# Patient Record
Sex: Male | Born: 1945 | Race: White | Hispanic: No | Marital: Married | Smoking: Former smoker
Health system: Southern US, Community
[De-identification: ages and names within clinical notes are randomized; demographics above are authoritative.]

## PROBLEM LIST (undated history)

## (undated) ENCOUNTER — Emergency Department (HOSPITAL_COMMUNITY): Payer: Self-pay | Source: Home / Self Care

## (undated) DIAGNOSIS — B029 Zoster without complications: Secondary | ICD-10-CM

## (undated) DIAGNOSIS — E785 Hyperlipidemia, unspecified: Secondary | ICD-10-CM

## (undated) DIAGNOSIS — I639 Cerebral infarction, unspecified: Secondary | ICD-10-CM

## (undated) DIAGNOSIS — E119 Type 2 diabetes mellitus without complications: Secondary | ICD-10-CM

## (undated) HISTORY — DX: Hyperlipidemia, unspecified: E78.5

## (undated) HISTORY — PX: NO PAST SURGERIES: SHX2092

---

## 2002-10-12 ENCOUNTER — Inpatient Hospital Stay (HOSPITAL_COMMUNITY): Admission: AC | Admit: 2002-10-12 | Discharge: 2002-10-25 | Payer: Self-pay

## 2002-10-12 ENCOUNTER — Encounter: Payer: Self-pay | Admitting: General Surgery

## 2002-10-12 ENCOUNTER — Encounter: Payer: Self-pay | Admitting: Emergency Medicine

## 2002-10-13 ENCOUNTER — Encounter: Payer: Self-pay | Admitting: General Surgery

## 2002-11-07 ENCOUNTER — Emergency Department (HOSPITAL_COMMUNITY): Admission: EM | Admit: 2002-11-07 | Discharge: 2002-11-07 | Payer: Self-pay | Admitting: Emergency Medicine

## 2002-12-15 ENCOUNTER — Encounter: Admission: RE | Admit: 2002-12-15 | Discharge: 2003-02-27 | Payer: Self-pay | Admitting: Orthopaedic Surgery

## 2007-11-19 ENCOUNTER — Emergency Department (HOSPITAL_COMMUNITY): Admission: EM | Admit: 2007-11-19 | Discharge: 2007-11-20 | Payer: Self-pay | Admitting: Emergency Medicine

## 2010-03-24 ENCOUNTER — Encounter: Payer: Self-pay | Admitting: Internal Medicine

## 2011-09-09 ENCOUNTER — Other Ambulatory Visit: Payer: Self-pay | Admitting: Family Medicine

## 2011-09-09 ENCOUNTER — Ambulatory Visit
Admission: RE | Admit: 2011-09-09 | Discharge: 2011-09-09 | Disposition: A | Discharge status: Home / Self Care | Payer: Medicare Other | Source: Ambulatory Visit | Attending: Family Medicine | Admitting: Family Medicine

## 2011-09-09 DIAGNOSIS — M25569 Pain in unspecified knee: Secondary | ICD-10-CM

## 2011-12-16 ENCOUNTER — Emergency Department (HOSPITAL_COMMUNITY): Payer: Medicare Other

## 2011-12-16 ENCOUNTER — Encounter (HOSPITAL_COMMUNITY): Payer: Self-pay | Admitting: Emergency Medicine

## 2011-12-16 ENCOUNTER — Observation Stay (HOSPITAL_COMMUNITY)
Admission: EM | Admit: 2011-12-16 | Discharge: 2011-12-18 | Disposition: A | Discharge status: Home / Self Care | Payer: Medicare Other | Attending: Internal Medicine | Admitting: Internal Medicine

## 2011-12-16 ENCOUNTER — Observation Stay (HOSPITAL_COMMUNITY): Payer: Medicare Other

## 2011-12-16 DIAGNOSIS — Z79899 Other long term (current) drug therapy: Secondary | ICD-10-CM | POA: Insufficient documentation

## 2011-12-16 DIAGNOSIS — R269 Unspecified abnormalities of gait and mobility: Secondary | ICD-10-CM | POA: Insufficient documentation

## 2011-12-16 DIAGNOSIS — R209 Unspecified disturbances of skin sensation: Secondary | ICD-10-CM | POA: Insufficient documentation

## 2011-12-16 DIAGNOSIS — G4700 Insomnia, unspecified: Secondary | ICD-10-CM | POA: Insufficient documentation

## 2011-12-16 DIAGNOSIS — I658 Occlusion and stenosis of other precerebral arteries: Secondary | ICD-10-CM | POA: Insufficient documentation

## 2011-12-16 DIAGNOSIS — Z7982 Long term (current) use of aspirin: Secondary | ICD-10-CM | POA: Insufficient documentation

## 2011-12-16 DIAGNOSIS — R569 Unspecified convulsions: Secondary | ICD-10-CM

## 2011-12-16 DIAGNOSIS — I10 Essential (primary) hypertension: Secondary | ICD-10-CM

## 2011-12-16 DIAGNOSIS — E785 Hyperlipidemia, unspecified: Secondary | ICD-10-CM

## 2011-12-16 DIAGNOSIS — G459 Transient cerebral ischemic attack, unspecified: Secondary | ICD-10-CM

## 2011-12-16 DIAGNOSIS — E119 Type 2 diabetes mellitus without complications: Secondary | ICD-10-CM

## 2011-12-16 DIAGNOSIS — K219 Gastro-esophageal reflux disease without esophagitis: Secondary | ICD-10-CM

## 2011-12-16 DIAGNOSIS — R471 Dysarthria and anarthria: Secondary | ICD-10-CM | POA: Insufficient documentation

## 2011-12-16 HISTORY — DX: Zoster without complications: B02.9

## 2011-12-16 HISTORY — DX: Transient cerebral ischemic attack, unspecified: G45.9

## 2011-12-16 HISTORY — DX: Type 2 diabetes mellitus without complications: E11.9

## 2011-12-16 LAB — RAPID URINE DRUG SCREEN, HOSP PERFORMED
Amphetamines: NOT DETECTED
Barbiturates: NOT DETECTED
Benzodiazepines: NOT DETECTED
Cocaine: NOT DETECTED
Opiates: NOT DETECTED
Tetrahydrocannabinol: NOT DETECTED

## 2011-12-16 LAB — URINALYSIS, ROUTINE W REFLEX MICROSCOPIC
Bilirubin Urine: NEGATIVE
Glucose, UA: >1000 mg/dL — AB
Hgb urine dipstick: NEGATIVE
Leukocytes, UA: NEGATIVE
Nitrite: NEGATIVE
Protein, ur: NEGATIVE mg/dL
Specific Gravity, Urine: 1.024 (ref 1.005–1.030)
Urobilinogen, UA: 0.2 mg/dL (ref 0.0–1.0)
pH: 6 (ref 5.0–8.0)

## 2011-12-16 LAB — CBC
HCT: 44.4 % (ref 39.0–52.0)
Hemoglobin: 16.3 g/dL (ref 13.0–17.0)
MCH: 31.7 pg (ref 26.0–34.0)
MCHC: 36.7 g/dL — ABNORMAL HIGH (ref 30.0–36.0)
MCV: 86.2 fL (ref 78.0–100.0)
Platelets: 165 10*3/uL (ref 150–400)
RBC: 5.15 MIL/uL (ref 4.22–5.81)
RDW: 12.6 % (ref 11.5–15.5)
WBC: 8.9 10*3/uL (ref 4.0–10.5)

## 2011-12-16 LAB — COMPREHENSIVE METABOLIC PANEL
ALT: 27 U/L (ref 0–53)
AST: 22 U/L (ref 0–37)
Albumin: 4 g/dL (ref 3.5–5.2)
Alkaline Phosphatase: 81 U/L (ref 39–117)
BUN: 15 mg/dL (ref 6–23)
CO2: 21 mEq/L (ref 19–32)
Calcium: 9.7 mg/dL (ref 8.4–10.5)
Chloride: 100 mEq/L (ref 96–112)
Creatinine, Ser: 0.81 mg/dL (ref 0.50–1.35)
GFR calc Af Amer: >90 mL/min (ref >90)
GFR calc non Af Amer: >90 mL/min (ref >90)
Glucose, Bld: 261 mg/dL — ABNORMAL HIGH (ref 70–99)
Potassium: 4.1 mEq/L (ref 3.5–5.1)
Sodium: 135 mEq/L (ref 135–145)
Total Bilirubin: 0.3 mg/dL (ref 0.3–1.2)
Total Protein: 6.6 g/dL (ref 6.0–8.3)

## 2011-12-16 LAB — CBC WITH DIFFERENTIAL/PLATELET
Basophils Absolute: 0.1 10*3/uL (ref 0.0–0.1)
Basophils Relative: 1 % (ref 0–1)
Eosinophils Absolute: 0.5 10*3/uL (ref 0.0–0.7)
Eosinophils Relative: 7 % — ABNORMAL HIGH (ref 0–5)
HCT: 44.7 % (ref 39.0–52.0)
Hemoglobin: 16.3 g/dL (ref 13.0–17.0)
Lymphocytes Relative: 29 % (ref 12–46)
Lymphs Abs: 2.3 10*3/uL (ref 0.7–4.0)
MCH: 31.3 pg (ref 26.0–34.0)
MCHC: 36.5 g/dL — ABNORMAL HIGH (ref 30.0–36.0)
MCV: 86 fL (ref 78.0–100.0)
Monocytes Absolute: 0.6 10*3/uL (ref 0.1–1.0)
Monocytes Relative: 8 % (ref 3–12)
Neutro Abs: 4.3 10*3/uL (ref 1.7–7.7)
Neutrophils Relative %: 55 % (ref 43–77)
Platelets: 170 10*3/uL (ref 150–400)
RBC: 5.2 MIL/uL (ref 4.22–5.81)
RDW: 12.7 % (ref 11.5–15.5)
WBC: 7.8 10*3/uL (ref 4.0–10.5)

## 2011-12-16 LAB — ETHANOL: Alcohol, Ethyl (B): <11 mg/dL (ref 0–11)

## 2011-12-16 LAB — URINE MICROSCOPIC-ADD ON

## 2011-12-16 LAB — GLUCOSE, CAPILLARY
Glucose-Capillary: 305 mg/dL — ABNORMAL HIGH (ref 70–99)
Glucose-Capillary: 111 mg/dL — ABNORMAL HIGH (ref 70–99)
Glucose-Capillary: 200 mg/dL — ABNORMAL HIGH (ref 70–99)

## 2011-12-16 LAB — CREATININE, SERUM
Creatinine, Ser: 0.81 mg/dL (ref 0.50–1.35)
GFR calc Af Amer: >90 mL/min (ref >90)
GFR calc non Af Amer: >90 mL/min (ref >90)

## 2011-12-16 LAB — TROPONIN I: Troponin I: <0.3 ng/mL (ref <0.30)

## 2011-12-16 MED ORDER — PANTOPRAZOLE SODIUM 40 MG PO TBEC
40.0000 mg | DELAYED_RELEASE_TABLET | Freq: Every day | ORAL | Status: DC
  Administered 2011-12-16 – 2011-12-18 (×3): 40 mg via ORAL
  Filled 2011-12-16 (×3): qty 1

## 2011-12-16 MED ORDER — ACETAMINOPHEN 325 MG PO TABS: 650.0000 mg | ORAL_TABLET | ORAL | Status: DC | PRN

## 2011-12-16 MED ORDER — ATORVASTATIN CALCIUM 10 MG PO TABS
10.0000 mg | ORAL_TABLET | Freq: Every day | ORAL | Status: DC
  Administered 2011-12-16 – 2011-12-17 (×2): 10 mg via ORAL
  Filled 2011-12-16 (×3): qty 1

## 2011-12-16 MED ORDER — ASPIRIN EC 81 MG PO TBEC
162.0000 mg | DELAYED_RELEASE_TABLET | Freq: Every day | ORAL | Status: DC
  Administered 2011-12-16: 162 mg via ORAL
  Filled 2011-12-16: qty 2

## 2011-12-16 MED ORDER — SODIUM CHLORIDE 0.9 % IV SOLN
INTRAVENOUS | Status: DC
  Administered 2011-12-16: 12:00:00 via INTRAVENOUS

## 2011-12-16 MED ORDER — CLOPIDOGREL BISULFATE 75 MG PO TABS
75.0000 mg | ORAL_TABLET | Freq: Every day | ORAL | Status: DC
  Administered 2011-12-17 – 2011-12-18 (×2): 75 mg via ORAL
  Filled 2011-12-16 (×4): qty 1

## 2011-12-16 MED ORDER — SODIUM CHLORIDE 0.9 % IV SOLN
INTRAVENOUS | Status: AC
  Administered 2011-12-16: 22:00:00 via INTRAVENOUS

## 2011-12-16 MED ORDER — LOSARTAN POTASSIUM 50 MG PO TABS
50.0000 mg | ORAL_TABLET | Freq: Every day | ORAL | Status: DC
  Administered 2011-12-16 – 2011-12-18 (×3): 50 mg via ORAL
  Filled 2011-12-16 (×3): qty 1

## 2011-12-16 MED ORDER — HEPARIN SODIUM (PORCINE) 5000 UNIT/ML IJ SOLN
5000.0000 [IU] | Freq: Three times a day (TID) | INTRAMUSCULAR | Status: DC
  Administered 2011-12-16 – 2011-12-18 (×5): 5000 [IU] via SUBCUTANEOUS
  Filled 2011-12-16 (×8): qty 1

## 2011-12-16 MED ORDER — GLIMEPIRIDE 4 MG PO TABS
4.0000 mg | ORAL_TABLET | Freq: Every day | ORAL | Status: DC
  Administered 2011-12-17 – 2011-12-18 (×2): 4 mg via ORAL
  Filled 2011-12-16 (×4): qty 1

## 2011-12-16 MED ORDER — INSULIN ASPART 100 UNIT/ML ~~LOC~~ SOLN
0.0000 [IU] | Freq: Three times a day (TID) | SUBCUTANEOUS | Status: DC
  Administered 2011-12-17: 3 [IU] via SUBCUTANEOUS
  Administered 2011-12-17 (×2): 2 [IU] via SUBCUTANEOUS

## 2011-12-16 MED ORDER — ASPIRIN 81 MG PO CHEW
324.0000 mg | CHEWABLE_TABLET | Freq: Once | ORAL | Status: AC
  Administered 2011-12-16: 324 mg via ORAL
  Filled 2011-12-16: qty 4

## 2011-12-16 NOTE — ED Notes (Signed)
Per wife/pt, patient has been having episodes of dizziness, staring off into space-went to PCP, he recommended a Neurologist

## 2011-12-16 NOTE — Consult Note (Signed)
Reason for Consult: Transient episodes of disequilibrium Referring Physician: Doug Sou  CC: Transient episodes of unsteadiness  History is obtained from: Patient  HPI: Gavin [name] is an 66 y.o. male now 3 episodes of concern over the past 2 weeks. The first occurred approximately 2 weeks ago after taking Ambien. He is told by his wife he was standing up going the bathroom repeatedly, may been stumbling slightly. He does not remember this episode  He stopped taking Ambien at that time, and has had 2 new episodes over the past 2 days.  The first occurred yesterday, he stood up and then felt like he suddenly became off balance. He went out to lunch with his wife who said that he was acting confused throughout the lunch. He has no recollection of the lunch. He states that this would have lasted for at least an hour.  Today, he again had onset of symptoms shortly after standing up. Today he remembers the event, and had disequilibrium as well as double vision. It was much shorter than yesterday's.   He states that he has had neck stiffness going on for at least a couple of days and is most prominent in his left posterior neck.  He denies prior episodes of lost time, he does have a history of head trauma at age 74.  ROS: An 11 point ROS was performed and is negative except as noted in the HPI.  Past Medical History  Diagnosis Date  . Diabetes mellitus without complication   . Shingles     Family History: No history of strokes or seizure  Social History: Tob: Denies EtOH: Occasional  Exam: Current vital signs: BP 142/71  Pulse 87  Temp 97.7 F (36.5 C) (Oral)  Resp 18  Ht 5\' 6"  (1.676 m)  Wt 76.658 kg (169 lb)  BMI 27.28 kg/m2  SpO2 99% Vital signs in last 24 hours: Temp:  [97.7 F (36.5 C)-98.7 F (37.1 C)] 97.7 F (36.5 C) (10/15 2149) Pulse Rate:  [72-91] 87  (10/15 2149) Resp:  [14-18] 18  (10/15 2149) BP: (131-142)/(71-86) 142/71 mmHg (10/15 2149) SpO2:   [98 %-100 %] 99 % (10/15 2149) Weight:  [76.658 kg (169 lb)] 76.658 kg (169 lb) (10/15 1824)  General: In bed, no apparent distress CV: Regular rate and rhythm Mental Status: Patient is awake, alert, oriented to person, place, month, year, and situation. Immediate and remote memory are intact. Patient is able to give a clear and coherent history. Cranial Nerves: II: Visual Fields are full. Pupils are equal, round, and reactive to light.  Discs are difficult to visualize. III,IV, VI: EOMI without ptosis or diploplia.  V,VII: Facial sensation and movement are symmetric.  VIII: hearing is intact to voice X: Uvula elevates symmetrically XI: Shoulder shrug is symmetric. XII: tongue is midline without atrophy or fasciculations.  Motor: Tone is normal. Bulk is normal. 5/5 strength was present in all four extremities.  Sensory: Sensation is symmetric to light touch and temperature in the arms and legs. Deep Tendon Reflexes: 2+ and symmetric in the biceps and patellae.  Cerebellar: FNF and HKS are intact bilaterally Gait: Did not assess 2/2 patient safety concerns  I have reviewed labs in epic and the results pertinent to this consultation are:  I have reviewed the images obtained: MRI brain - no infarct  Impression: 66 year old male with 2 episodes of transient disequilibrium, diplopia, slurred speech, one of which was associated with amnesia. The episode 2 weeks prior is difficult to assess given  that he was taking Ambien.  History is concerning for posterior circulation TIAs, particularly with his left neck pain, I am concerned that he may have had a left vertebral dissection. This could cause posterior circulation insufficiency resulting hypoperfusion after standing. I agree that changing him to Plavix would be prudent given that these TIAs have occurred while on aspirin.  The posterior circulation does contribute to memory, but a amnestic period of an hour is unusual for TIA. Given  this, I also feel that an EEG is warranted as seizure is in the differential. If this is negative, then I would consider this a TIA.  Recommendations: 1. HgbA1c, fasting lipid panel 2. MRA  Neck with contrast 3. Echocardiogram 4. No need for carotid dopplers given MRA neck being ordered 5. Prophylactic therapy-Antiplatelet med: Plavix 75mg  Qday 6. Risk factor modification 7. Telemetry monitoring 8. Frequent neuro checks 9. EEG  Ritta Slot, MD [facility] [phone]  If 7pm- 7am, please page neurology on call at [phone].

## 2011-12-16 NOTE — ED Provider Notes (Signed)
History     CSN: [id number]  Arrival date & time 12/16/11  1041   First MD Initiated Contact with Patient 12/16/11 1112      Chief Complaint  Patient presents with  . Altered Mental Status    (Consider location/radiation/quality/duration/timing/severity/associated sxs/prior treatment) HPI Pt reports a couple of weeks ago after he took an Ambien, he was acting strangely and his wife called EMS, they evaluated him but did not transport him. Yesterday he was out to dinner with his wife and son and states he had episodes of acting strange "like he was drunk" unsteady walking and falling asleep at the dinner table. He went to see his PCP who recommended outpatient neurology appointment but symptoms returned today and he felt like his L arm was heavy so he came to the ED for evaluation. He has not had any additional Ambien recently. History of diabetes but apparently inconsistent with medications. Wife dropped patient off and went shopping. She is unavailable for discussion at the time of my evaluation.   Past Medical History  Diagnosis Date  . Diabetes mellitus without complication     History reviewed. No pertinent past surgical history.  No family history on file.  History  Substance Use Topics  . Smoking status: Never Smoker   . Smokeless tobacco: Never Used  . Alcohol Use: No      Review of Systems All other systems reviewed and are negative except as noted in HPI.   Allergies  Review of patient's allergies indicates no known allergies.  Home Medications   Current Outpatient Rx  Name Route Sig Dispense Refill  . ASPIRIN 81 MG PO TABS Oral Take 81 mg by mouth daily.    . ATORVASTATIN CALCIUM 10 MG PO TABS Oral Take 10 mg by mouth daily.    Marland Kitchen GLIMEPIRIDE 4 MG PO TABS Oral Take 4 mg by mouth daily before breakfast.    . LOSARTAN POTASSIUM 50 MG PO TABS Oral Take 50 mg by mouth daily.    Marland Kitchen METFORMIN HCL 850 MG PO TABS Oral Take 850 mg by mouth 3 (three) times daily.      Marland Kitchen OMEPRAZOLE 40 MG PO CPDR Oral Take 40 mg by mouth daily.    Marland Kitchen SOLIFENACIN SUCCINATE 5 MG PO TABS Oral Take 5 mg by mouth daily.    Marland Kitchen TADALAFIL 20 MG PO TABS Oral Take 20 mg by mouth daily as needed. Erectile dysfunction      BP 138/79  Pulse 91  Temp 98.7 F (37.1 C) (Oral)  Resp 16  SpO2 98%  Physical Exam  Nursing note and vitals reviewed. Constitutional: He is oriented to person, place, and time. He appears well-developed and well-nourished.  HENT:  Head: Normocephalic and atraumatic.  Eyes: EOM are normal. Pupils are equal, round, and reactive to light.  Neck: Normal range of motion. Neck supple.  Cardiovascular: Normal rate, normal heart sounds and intact distal pulses.   Pulmonary/Chest: Effort normal and breath sounds normal.  Abdominal: Bowel sounds are normal. He exhibits no distension. There is no tenderness.  Musculoskeletal: Normal range of motion. He exhibits no edema and no tenderness.  Neurological: He is alert and oriented to person, place, and time. He has normal strength. No cranial nerve deficit or sensory deficit.  Skin: Skin is warm and dry. No rash noted.  Psychiatric: He has a normal mood and affect.    ED Course  Procedures (including critical care time)  Labs Reviewed  GLUCOSE, CAPILLARY - Abnormal;  Notable for the following:    Glucose-Capillary 305 (*)     All other components within normal limits  CBC WITH DIFFERENTIAL - Abnormal; Notable for the following:    MCHC 36.5 (*)     Eosinophils Relative 7 (*)     All other components within normal limits  COMPREHENSIVE METABOLIC PANEL - Abnormal; Notable for the following:    Glucose, Bld 261 (*)     All other components within normal limits  URINALYSIS, ROUTINE W REFLEX MICROSCOPIC - Abnormal; Notable for the following:    Glucose, UA >1000 (*)     Ketones, ur TRACE (*)     All other components within normal limits  URINE RAPID DRUG SCREEN (HOSP PERFORMED)  TROPONIN I  ETHANOL  URINE  MICROSCOPIC-ADD ON   Dg Chest 2 View  12/16/2011  *RADIOLOGY REPORT*  Clinical Data: Altered mental status.  Nonsmoker.  No chest complaints.  CHEST - 2 VIEW  Comparison: None.  Findings: Heart size top normal.  Mildly tortuous aorta. No infiltrate, congestive heart failure or pneumothorax.  Thoracic kyphosis with degenerative changes.  IMPRESSION: Mildly tortuous aorta.  Top normal heart size.  No infiltrate or congestive heart failure.   Original Report Authenticated By: Fuller Canada, M.D.    Ct Head Wo Contrast  12/16/2011  *RADIOLOGY REPORT*  Clinical Data: Altered mental status  CT HEAD WITHOUT CONTRAST  Technique:  Contiguous axial images were obtained from the base of the skull through the vertex without contrast. Study was obtained within 24 hours of patient arrival at the hospital.  Comparison: None.  Findings:  Ventricles are normal in size and configuration.  There is no mass, hemorrhage, extra-axial fluid collection, or midline shift.  There is a punctate focus of calcification just to the left of the posterior falx in the medial left parietal lobe which may represent a tiny granuloma.  No other calcifications identified.  Gray-white compartments are normal. There is no evidence of acute infarct.  Bony calvarium appears intact.  The mastoid air cells are clear. There is ethmoid sinus disease bilaterally.  IMPRESSION: Small calcification to the left of the falx in the medial left parietal lobe.  This finding is of questionable significance. It may represent a small granuloma.  There is ethmoid sinus disease.  Study is otherwise unremarkable.   Original Report Authenticated By: Arvin Collard. WOODRUFF III, M.D.      No diagnosis found.    MDM   Date: 12/16/2011  Rate: 89  Rhythm: normal sinus rhythm  QRS Axis: normal  Intervals: normal  ST/T Wave abnormalities: normal  Conduction Disutrbances: none  Narrative Interpretation: unremarkable  1:52 PM Pt has been asymptomatic in the  ED. Wife and son at bedside now confirm history as above. Pt now states he was also seeing 'double and triple' during these spells. I question partial seizures vs multiple sclerosis. Will send for MRI.   Care signed out to Dr. Ethelda Chick pending MRI and Neurology discussion.       Charles B. Bernette Mayers, MD 12/17/11 1321

## 2011-12-16 NOTE — ED Notes (Signed)
Off floor for testing 

## 2011-12-16 NOTE — ED Provider Notes (Addendum)
Does have 3 episodes over the past 4 weeks where he is seeing double or triple, has had trouble walking "walking like a drunk" had first episode 4 weeks ago second episode yesterday lasting 90 minutes and third episode today lasting 2 hours, symptoms resolved spontaneously without treatment he is presently asymptomatic. On exam alert Glasgow Coma Score 15 moves all extremities well canners 2 through 12 grossly intact motor strength 5 over 5 overall pronator drift normal finger-nose normal heel-to-shin normal DTR symmetric bilaterally at knee jerk ankle jerk biceps toes downward going bilaterally Spoke with Dr. Thad Ranger from neurology who will see patient in consultation. She is concerned for posterior circulation TIA Results for orders placed during the hospital encounter of 12/16/11  GLUCOSE, CAPILLARY      Component Value Range   Glucose-Capillary 305 (*) 70 - 99 mg/dL   Comment 1 Notify RN     Comment 2 Documented in Chart    CBC WITH DIFFERENTIAL      Component Value Range   WBC 7.8  4.0 - 10.5 K/uL   RBC 5.20  4.22 - 5.81 MIL/uL   Hemoglobin 16.3  13.0 - 17.0 g/dL   HCT 16.1  09.6 - 04.5 %   MCV 86.0  78.0 - 100.0 fL   MCH 31.3  26.0 - 34.0 pg   MCHC 36.5 (*) 30.0 - 36.0 g/dL   RDW 40.9  81.1 - 91.4 %   Platelets 170  150 - 400 K/uL   Neutrophils Relative 55  43 - 77 %   Neutro Abs 4.3  1.7 - 7.7 K/uL   Lymphocytes Relative 29  12 - 46 %   Lymphs Abs 2.3  0.7 - 4.0 K/uL   Monocytes Relative 8  3 - 12 %   Monocytes Absolute 0.6  0.1 - 1.0 K/uL   Eosinophils Relative 7 (*) 0 - 5 %   Eosinophils Absolute 0.5  0.0 - 0.7 K/uL   Basophils Relative 1  0 - 1 %   Basophils Absolute 0.1  0.0 - 0.1 K/uL  COMPREHENSIVE METABOLIC PANEL      Component Value Range   Sodium 135  135 - 145 mEq/L   Potassium 4.1  3.5 - 5.1 mEq/L   Chloride 100  96 - 112 mEq/L   CO2 21  19 - 32 mEq/L   Glucose, Bld 261 (*) 70 - 99 mg/dL   BUN 15  6 - 23 mg/dL   Creatinine, Ser 7.82  0.50 - 1.35 mg/dL   Calcium 9.7  8.4 - 95.6 mg/dL   Total Protein 6.6  6.0 - 8.3 g/dL   Albumin 4.0  3.5 - 5.2 g/dL   AST 22  0 - 37 U/L   ALT 27  0 - 53 U/L   Alkaline Phosphatase 81  39 - 117 U/L   Total Bilirubin 0.3  0.3 - 1.2 mg/dL   GFR calc non Af Amer >90  >90 mL/min   GFR calc Af Amer >90  >90 mL/min  URINALYSIS, ROUTINE W REFLEX MICROSCOPIC      Component Value Range   Color, Urine YELLOW  YELLOW   APPearance CLEAR  CLEAR   Specific Gravity, Urine 1.024  1.005 - 1.030   pH 6.0  5.0 - 8.0   Glucose, UA >1000 (*) NEGATIVE mg/dL   Hgb urine dipstick NEGATIVE  NEGATIVE   Bilirubin Urine NEGATIVE  NEGATIVE   Ketones, ur TRACE (*) NEGATIVE mg/dL   Protein, ur NEGATIVE  NEGATIVE mg/dL   Urobilinogen, UA 0.2  0.0 - 1.0 mg/dL   Nitrite NEGATIVE  NEGATIVE   Leukocytes, UA NEGATIVE  NEGATIVE  URINE RAPID DRUG SCREEN (HOSP PERFORMED)      Component Value Range   Opiates NONE DETECTED  NONE DETECTED   Cocaine NONE DETECTED  NONE DETECTED   Benzodiazepines NONE DETECTED  NONE DETECTED   Amphetamines NONE DETECTED  NONE DETECTED   Tetrahydrocannabinol NONE DETECTED  NONE DETECTED   Barbiturates NONE DETECTED  NONE DETECTED  TROPONIN I      Component Value Range   Troponin I <0.30  <0.30 ng/mL  ETHANOL      Component Value Range   Alcohol, Ethyl (B) <11  0 - 11 mg/dL  URINE MICROSCOPIC-ADD ON      Component Value Range   Bacteria, UA RARE  RARE   Urine-Other MUCOUS PRESENT     Dg Chest 2 View  12/16/2011  *RADIOLOGY REPORT*  Clinical Data: Altered mental status.  Nonsmoker.  No chest complaints.  CHEST - 2 VIEW  Comparison: None.  Findings: Heart size top normal.  Mildly tortuous aorta. No infiltrate, congestive heart failure or pneumothorax.  Thoracic kyphosis with degenerative changes.  IMPRESSION: Mildly tortuous aorta.  Top normal heart size.  No infiltrate or congestive heart failure.   Original Report Authenticated By: Fuller Canada, M.D.    Ct Head Wo Contrast  12/16/2011  *RADIOLOGY  REPORT*  Clinical Data: Altered mental status  CT HEAD WITHOUT CONTRAST  Technique:  Contiguous axial images were obtained from the base of the skull through the vertex without contrast. Study was obtained within 24 hours of patient arrival at the hospital.  Comparison: None.  Findings:  Ventricles are normal in size and configuration.  There is no mass, hemorrhage, extra-axial fluid collection, or midline shift.  There is a punctate focus of calcification just to the left of the posterior falx in the medial left parietal lobe which may represent a tiny granuloma.  No other calcifications identified.  Gray-white compartments are normal. There is no evidence of acute infarct.  Bony calvarium appears intact.  The mastoid air cells are clear. There is ethmoid sinus disease bilaterally.  IMPRESSION: Small calcification to the left of the falx in the medial left parietal lobe.  This finding is of questionable significance. It may represent a small granuloma.  There is ethmoid sinus disease.  Study is otherwise unremarkable.   Original Report Authenticated By: Arvin Collard. WOODRUFF III, M.D.    Mr Brain Wo Contrast  12/16/2011  *RADIOLOGY REPORT*  Clinical Data: Altered mental status  MRI HEAD WITHOUT CONTRAST  Technique:  Multiplanar, multiecho pulse sequences of the brain and surrounding structures were obtained according to standard protocol without intravenous contrast.  Comparison: CT 12/16/2011  Findings: Mild atrophy and minimal chronic microvascular ischemic change in the white matter.  Negative for acute infarct.  Brainstem and cerebellum are normal.  Negative for intracranial hemorrhage.  No mass or edema.  3 mm calcification left medial parietal lobe on the CT is not identified on MRI.  This may be due to chronic infection.  No surrounding edema.  Chronic sinusitis.  Vessels at the base of the brain are patent.  IMPRESSION: Atrophy and minimal chronic microvascular ischemic change in the white matter.  No  acute infarct or mass.   Original Report Authenticated By: Camelia Phenes, M.D.     She requests hospitalist evaluate patient for inpatient stay Spoke with Dr Gwenlyn Perking  preop observation, telemetry  Doug Sou, MD 12/16/11 1713  Doug Sou, MD 12/16/11 1718

## 2011-12-16 NOTE — ED Notes (Signed)
Error in Ecologist. US Carotid duplex NOT COMPLETED.

## 2011-12-16 NOTE — H&P (Signed)
[facility] History and Physical  Parley Pidcock JYN:[id number] DOB: May 04, 1945 DOA: 12/16/2011  Referring physician: Dr. Ethelda Chick PCP: Laurell Josephs, MD    Chief Complaint: Leg numbness, dysarthria, spacing off and unsteady gait  HPI: Gavin [name] is a 66 y.o. male with pmh of DM, HTN, HLD and GERD; came to the ED with complaints of acute episode of dysarthria, LE numbness, unsteady gait and spacing off episodes. Patient reports he had experience a total of 3 episodes in the last 4 weeks; symptoms has lasted up to 2 hours and then resolved on their own. Patient denies any fever, CP, palpitation, cough, SOB, nausea, vomiting, melena, HA's or any other complaints. He reports having some  Diplopia especially with far sight.  Of note, there is the possibility of his symptoms appearing after or around the use of sleeping aids; happened once with ambien and the drug was discontinue. Currently using PRN OTC sleeping aids.     Review of Systems:  Negative except as otherwise mentioned on HPI.  Past Medical History  Diagnosis Date  . Diabetes mellitus without complication   . Shingles    Past Surgical History  Procedure Date  . No past surgeries    Social History:  reports that he has never smoked. He has never used smokeless tobacco. He reports that he drinks alcohol. He reports that he does not use illicit drugs. Came from home; no assistance with ADL's  No Known Allergies  Family hx: diabetes and HTN; no other pertinent family hx reported. (denies strokes or seizure disorders on his family)  Prior to Admission medications   Medication Sig Start Date End Date Taking? Authorizing Provider  aspirin 81 MG tablet Take 81 mg by mouth daily.   Yes Historical Provider, MD  atorvastatin (LIPITOR) 10 MG tablet Take 10 mg by mouth daily.   Yes Historical Provider, MD  glimepiride (AMARYL) 4 MG tablet Take 4 mg by mouth daily before breakfast.   Yes Historical Provider, MD    losartan (COZAAR) 50 MG tablet Take 50 mg by mouth daily.   Yes Historical Provider, MD  metFORMIN (GLUCOPHAGE) 850 MG tablet Take 850 mg by mouth 3 (three) times daily.   Yes Historical Provider, MD  omeprazole (PRILOSEC) 40 MG capsule Take 40 mg by mouth daily.   Yes Historical Provider, MD  solifenacin (VESICARE) 5 MG tablet Take 5 mg by mouth daily.   Yes Historical Provider, MD  tadalafil (CIALIS) 20 MG tablet Take 20 mg by mouth daily as needed. Erectile dysfunction   Yes Historical Provider, MD   Physical Exam: Filed Vitals:   12/16/11 1219 12/16/11 1616 12/16/11 1800 12/16/11 1802  BP: 138/74 132/73 131/86   Pulse: 73 74  81  Temp:      TempSrc:      Resp: 14 16  16   SpO2: 99% 100%       General:  NAD, no facial droop; no dysarthria, afebrile.  Eyes: no icterus, no nystagmus, EOMI, PERRLA  ENT: moist MM, no erythema or exudates; no tongue or uvula deviation. Patient w/o discharges out of his ears or nostrils.  Neck: no JVD, no bruits, no thyromegaly  Cardiovascular: S1 and S2; RRR, no rubs or gallops, no murmurs  Respiratory: CTA  Abdomen: soft, NT, ND, positive BS  Skin: no rash or petechiae  Musculoskeletal: FROM, no erythema or swelling of his joints  Psychiatric: appropriate  Neurologic: CN intact, no focal deficit, MS 5/5; normal finger to nose  Labs on  Admission:  Basic Metabolic Panel:  Lab 12/16/11 0981  NA 135  K 4.1  CL 100  CO2 21  GLUCOSE 261*  BUN 15  CREATININE 0.81  CALCIUM 9.7  MG --  PHOS --   Liver Function Tests:  Lab 12/16/11 1110  AST 22  ALT 27  ALKPHOS 81  BILITOT 0.3  PROT 6.6  ALBUMIN 4.0   CBC:  Lab 12/16/11 1110  WBC 7.8  NEUTROABS 4.3  HGB 16.3  HCT 44.7  MCV 86.0  PLT 170   Cardiac Enzymes:  Lab 12/16/11 1110  CKTOTAL --  CKMB --  CKMBINDEX --  TROPONINI <0.30    CBG:  Lab 12/16/11 1051  GLUCAP 305*    Radiological Exams on Admission: Dg Chest 2 View  12/16/2011  *RADIOLOGY REPORT*   Clinical Data: Altered mental status.  Nonsmoker.  No chest complaints.  CHEST - 2 VIEW  Comparison: None.  Findings: Heart size top normal.  Mildly tortuous aorta. No infiltrate, congestive heart failure or pneumothorax.  Thoracic kyphosis with degenerative changes.  IMPRESSION: Mildly tortuous aorta.  Top normal heart size.  No infiltrate or congestive heart failure.   Original Report Authenticated By: Fuller Canada, M.D.    Ct Head Wo Contrast  12/16/2011  *RADIOLOGY REPORT*  Clinical Data: Altered mental status  CT HEAD WITHOUT CONTRAST  Technique:  Contiguous axial images were obtained from the base of the skull through the vertex without contrast. Study was obtained within 24 hours of patient arrival at the hospital.  Comparison: None.  Findings:  Ventricles are normal in size and configuration.  There is no mass, hemorrhage, extra-axial fluid collection, or midline shift.  There is a punctate focus of calcification just to the left of the posterior falx in the medial left parietal lobe which may represent a tiny granuloma.  No other calcifications identified.  Gray-white compartments are normal. There is no evidence of acute infarct.  Bony calvarium appears intact.  The mastoid air cells are clear. There is ethmoid sinus disease bilaterally.  IMPRESSION: Small calcification to the left of the falx in the medial left parietal lobe.  This finding is of questionable significance. It may represent a small granuloma.  There is ethmoid sinus disease.  Study is otherwise unremarkable.   Original Report Authenticated By: Arvin Collard. WOODRUFF III, M.D.    Mr Brain Wo Contrast  12/16/2011  *RADIOLOGY REPORT*  Clinical Data: Altered mental status  MRI HEAD WITHOUT CONTRAST  Technique:  Multiplanar, multiecho pulse sequences of the brain and surrounding structures were obtained according to standard protocol without intravenous contrast.  Comparison: CT 12/16/2011  Findings: Mild atrophy and minimal chronic  microvascular ischemic change in the white matter.  Negative for acute infarct.  Brainstem and cerebellum are normal.  Negative for intracranial hemorrhage.  No mass or edema.  3 mm calcification left medial parietal lobe on the CT is not identified on MRI.  This may be due to chronic infection.  No surrounding edema.  Chronic sinusitis.  Vessels at the base of the brain are patent.  IMPRESSION: Atrophy and minimal chronic microvascular ischemic change in the white matter.  No acute infarct or mass.   Original Report Authenticated By: Camelia Phenes, M.D.     EKG:  Rate: 89  Rhythm: normal sinus rhythm  QRS Axis: normal  Intervals: normal  ST/T Wave abnormalities: normal  Conduction Disutrbances: none  Narrative Interpretation: unremarkable   Assessment/Plan 1-TIA (transient ischemic attack): with concerns for  posterior circulation TIA. Patient with negative CT and MRI in the ED. (concerns for partial seizure or abscense seizure). -Will admit to telemetry  -2-D echo and carotid dopplers -Will check A1C, lipid panel and also B12 and TSH -Will check EEG -Patient symptoms has occurred while he has been using ASA. Will double to 162 daily for now and will follow neurology recommendations. In my opinion will benefit of plavix at this point for secondary prevention.  2-DM (diabetes mellitus): SSI and glimepiride. Will hold metformin while in the hospital. Will check A1C  3-HLD (hyperlipidemia): continue statins. Will checl lipid panel  4-HTN (hypertension): continue cozaar; BP stable and well controlled.  5-GERD (gastroesophageal reflux disease): continue PPI  6-Hx of insomnia: will hold off on any sleeping aids at this moment. Sleep hygiene discuss with patient.  DVT: continue heparin   Code Status: Full Family Communication: daughter and wife at bedside  Disposition Plan: will admit to telemetry for TIA workup; neurology has been consulted; will follow further rec's. Hopefully home in  1-2 midnights.  Time spent: >30 minutes  Keagon Glascoe [facility] Pager [phone]  If 7PM-7AM, please contact night-coverage [email or web address] Password [facility] 12/16/2011, 6:23 PM

## 2011-12-17 ENCOUNTER — Observation Stay (HOSPITAL_COMMUNITY): Payer: Medicare Other

## 2011-12-17 ENCOUNTER — Encounter (HOSPITAL_COMMUNITY): Payer: Self-pay | Admitting: *Deleted

## 2011-12-17 ENCOUNTER — Observation Stay (HOSPITAL_COMMUNITY)
Admit: 2011-12-17 | Discharge: 2011-12-17 | Disposition: A | Discharge status: Home / Self Care | Payer: Medicare Other | Attending: Internal Medicine | Admitting: Internal Medicine

## 2011-12-17 ENCOUNTER — Other Ambulatory Visit (HOSPITAL_COMMUNITY): Payer: Medicare Other

## 2011-12-17 DIAGNOSIS — R569 Unspecified convulsions: Secondary | ICD-10-CM

## 2011-12-17 DIAGNOSIS — G459 Transient cerebral ischemic attack, unspecified: Secondary | ICD-10-CM

## 2011-12-17 LAB — LIPID PANEL
Cholesterol: 124 mg/dL (ref 0–200)
HDL: 35 mg/dL — ABNORMAL LOW (ref >39)
LDL Cholesterol: 65 mg/dL (ref 0–99)
Total CHOL/HDL Ratio: 3.5 RATIO
Triglycerides: 118 mg/dL (ref <150)
VLDL: 24 mg/dL (ref 0–40)

## 2011-12-17 LAB — GLUCOSE, CAPILLARY
Glucose-Capillary: 196 mg/dL — ABNORMAL HIGH (ref 70–99)
Glucose-Capillary: 241 mg/dL — ABNORMAL HIGH (ref 70–99)
Glucose-Capillary: 176 mg/dL — ABNORMAL HIGH (ref 70–99)
Glucose-Capillary: 211 mg/dL — ABNORMAL HIGH (ref 70–99)

## 2011-12-17 LAB — HEMOGLOBIN A1C
Hgb A1c MFr Bld: 7.2 % — ABNORMAL HIGH (ref <5.7)
Mean Plasma Glucose: 160 mg/dL — ABNORMAL HIGH (ref <117)

## 2011-12-17 LAB — VITAMIN B12: Vitamin B-12: 531 pg/mL (ref 211–911)

## 2011-12-17 LAB — TSH: TSH: 1.526 u[IU]/mL (ref 0.350–4.500)

## 2011-12-17 MED ORDER — INSULIN ASPART 100 UNIT/ML ~~LOC~~ SOLN
0.0000 [IU] | Freq: Every day | SUBCUTANEOUS | Status: DC
  Administered 2011-12-17: 2 [IU] via SUBCUTANEOUS

## 2011-12-17 MED ORDER — IOHEXOL 350 MG/ML SOLN
100.0000 mL | Freq: Once | INTRAVENOUS | Status: AC | PRN
  Administered 2011-12-17: 100 mL via INTRAVENOUS

## 2011-12-17 MED ORDER — INSULIN ASPART 100 UNIT/ML ~~LOC~~ SOLN
0.0000 [IU] | Freq: Three times a day (TID) | SUBCUTANEOUS | Status: DC
  Administered 2011-12-18: 5 [IU] via SUBCUTANEOUS

## 2011-12-17 MED ORDER — GADOBENATE DIMEGLUMINE 529 MG/ML IV SOLN
15.0000 mL | Freq: Once | INTRAVENOUS | Status: AC | PRN
  Administered 2011-12-17: 15 mL via INTRAVENOUS

## 2011-12-17 MED ORDER — INSULIN GLARGINE 100 UNIT/ML ~~LOC~~ SOLN
5.0000 [IU] | Freq: Every day | SUBCUTANEOUS | Status: DC
  Administered 2011-12-17: 5 [IU] via SUBCUTANEOUS

## 2011-12-17 NOTE — Progress Notes (Signed)
   CARE MANAGEMENT NOTE 12/17/2011  Patient:  Gavin [name], Gavin [name]   Account Number:  [id number]  Date Initiated:  12/17/2011  Documentation initiated by:  Jiles Crocker  Subjective/Objective Assessment:   ADMITTED WITH TIA     Action/Plan:   PCP: Laurell Josephs, MD  LIVES AT HOME WITH SPOUSE   Anticipated DC Date:  12/19/2011   Anticipated DC Plan:  HOME/SELF CARE          Status of service:  In process, will continue to follow Medicare Important Message given?  NA - LOS <3 / Initial given by admissions (If response is "NO", the following Medicare IM given date fields will be blank)  Per UR Regulation:  Reviewed for med. necessity/level of care/duration of stay  Comments:  12/17/2011- B Secilia Apps RN, BSN, MHA

## 2011-12-17 NOTE — Progress Notes (Signed)
Subjective: Spoke with patient and wife this morning and updated them and all information as available. The patient states that he feels back to baseline. However he's unsure of his gait at this time. Please note the patient's presenting complaint included disequilibrium.  Interval history: I. spoke with neurology who feels that based upon the MRA that he may need a CT angiogram to further evaluate the posterior vessels to rule out dissection. This information came from the physician's assistant and he will discuss it with Dr. Roseanne Reno for more definitive plan. Objective: Filed Vitals:   12/17/11 0956 12/17/11 1439 12/17/11 1442 12/17/11 1444  BP: 134/77 147/72 126/95 129/73  Pulse: 82 86 89 107  Temp: 98.1 F (36.7 C) 97.8 F (36.6 C)    TempSrc: Oral Oral    Resp: 18 18 18 18   Height:      Weight:      SpO2: 99% 98%     Weight change:   Intake/Output Summary (Last 24 hours) at 12/17/11 1811 Last data filed at 12/17/11 1300  Gross per 24 hour  Intake   1080 ml  Output    700 ml  Net    380 ml    General: Alert, awake, oriented x3, in no acute distress.  HEENT: [place]/AT PEERL, EOMI Neck: Trachea midline,  no masses, no thyromegal,y no JVD, no carotid bruit Heart: Regular rate and rhythm, without murmurs, rubs, gallops.  Lungs: Clear to auscultation, no wheezing or rhonchi noted. No increased vocal fremitus resonant to percussion  Abdomen: Soft, nontender, nondistended, positive bowel sounds, no masses no hepatosplenomegaly noted..  Neuro: No focal neurological deficits noted cranial nerves II through XII grossly intact. DTRs 2+ bilaterally upper and lower extremities. Strength 5/5 in bilateral upper and lower extremities. Musculoskeletal: No warm swelling or erythema around joints, no spinal tenderness noted. Psychiatric: Patient alert and oriented x3, good insight and cognition, good recent to remote recall.      Lab Results:  Basename 12/16/11 1855 12/16/11 1110  NA -- 135    K -- 4.1  CL -- 100  CO2 -- 21  GLUCOSE -- 261*  BUN -- 15  CREATININE 0.81 0.81  CALCIUM -- 9.7  MG -- --  PHOS -- --    Basename 12/16/11 1110  AST 22  ALT 27  ALKPHOS 81  BILITOT 0.3  PROT 6.6  ALBUMIN 4.0   No results found for this basename: LIPASE:2,AMYLASE:2 in the last 72 hours  Basename 12/16/11 1855 12/16/11 1110  WBC 8.9 7.8  NEUTROABS -- 4.3  HGB 16.3 16.3  HCT 44.4 44.7  MCV 86.2 86.0  PLT 165 170    Basename 12/16/11 1110  CKTOTAL --  CKMB --  CKMBINDEX --  TROPONINI <0.30   No components found with this basename: POCBNP:3 No results found for this basename: DDIMER:2 in the last 72 hours  Basename 12/16/11 1855  HGBA1C 7.2*    Basename 12/17/11 0431  CHOL 124  HDL 35*  LDLCALC 65  TRIG 409  CHOLHDL 3.5  LDLDIRECT --    Basename 12/16/11 1855  TSH 1.526  T4TOTAL --  T3FREE --  THYROIDAB --    Basename 12/16/11 1855  VITAMINB12 531  FOLATE --  FERRITIN --  TIBC --  IRON --  RETICCTPCT --    Micro Results: No results found for this or any previous visit (from the past 240 hour(s)).  Studies/Results: Dg Chest 2 View  12/16/2011  *RADIOLOGY REPORT*  Clinical Data: Altered mental status.  Nonsmoker.  No chest complaints.  CHEST - 2 VIEW  Comparison: None.  Findings: Heart size top normal.  Mildly tortuous aorta. No infiltrate, congestive heart failure or pneumothorax.  Thoracic kyphosis with degenerative changes.  IMPRESSION: Mildly tortuous aorta.  Top normal heart size.  No infiltrate or congestive heart failure.   Original Report Authenticated By: Fuller Canada, M.D.    Ct Head Wo Contrast  12/16/2011  *RADIOLOGY REPORT*  Clinical Data: Altered mental status  CT HEAD WITHOUT CONTRAST  Technique:  Contiguous axial images were obtained from the base of the skull through the vertex without contrast. Study was obtained within 24 hours of patient arrival at the hospital.  Comparison: None.  Findings:  Ventricles are normal in  size and configuration.  There is no mass, hemorrhage, extra-axial fluid collection, or midline shift.  There is a punctate focus of calcification just to the left of the posterior falx in the medial left parietal lobe which may represent a tiny granuloma.  No other calcifications identified.  Gray-white compartments are normal. There is no evidence of acute infarct.  Bony calvarium appears intact.  The mastoid air cells are clear. There is ethmoid sinus disease bilaterally.  IMPRESSION: Small calcification to the left of the falx in the medial left parietal lobe.  This finding is of questionable significance. It may represent a small granuloma.  There is ethmoid sinus disease.  Study is otherwise unremarkable.   Original Report Authenticated By: Arvin Collard. WOODRUFF III, M.D.    Ct Angio Neck W/cm &/or Wo/cm  12/17/2011  *RADIOLOGY REPORT*  Clinical Data:  Neck discomfort. Transient episode of imbalance. Possible left leg tingling intermittently.  CT ANGIOGRAPHY NECK  Technique:  Multidetector CT imaging of the neck was performed using the standard protocol during bolus administration of intravenous contrast.  Multiplanar CT image reconstructions including MIPs were obtained to evaluate the vascular anatomy. Carotid stenosis measurements (when applicable) are obtained utilizing NASCET criteria, using the distal internal carotid diameter as the denominator.  Contrast: OMNIPAQUE IOHEXOL 350 MG/ML SOLN  Comparison:   MRA neck without and with contrast 12/17/2011.  Findings:  There is conventional branching great vessels and arch. There is no proximal flow reducing lesion.  There is minimal nonstenotic atherosclerotic calcification of both carotid bifurcations.  The cervical internal carotid arteries widely patent without dissection or fibromuscular disease.  Both vertebral arteries are patent with the right slightly larger. Both contribute to formation of the basilar.  There is no focal dissection or  narrowing.  Moderate cervical spondylosis is present with disc space narrowing at C5-6, C6-7, and C7-T1.  No neck masses are seen.  Lung apices grossly clear.  Trachea midline.  Thyroid gland unremarkable.  No destructive osseous lesions.  Visualized intracranial compartment negative.   Review of the MIP images confirms the above findings.  IMPRESSION: Unremarkable CT angiography of the neck.  No proximal flow reducing lesion or dissection.  Good general agreement with earlier MRA neck.  Cervical spondylosis with disc space narrowing C5-6, C6-7, and C7- T1.   Original Report Authenticated By: Elsie Stain, M.D.    Mr [facility] Wo Contrast  12/17/2011  *RADIOLOGY REPORT*  Clinical Data: Posterior circulation assessment.  Concern for TIA.  MRA HEAD WITHOUT CONTRAST  Technique: Angiographic images of the Circle of Willis were obtained using MRA technique without intravenous contrast.  Comparison: MR brain 12/16/2011.  Findings: Right vertebral artery is dominant.  Minimal irregularity of the distal left vertebral artery beyond the  takeoff of the left PICA.  Moderate narrowing and irregularity of the left PICA.  This is at the resolution of the present technique.  Ectatic basilar artery without significant stenosis or irregularity.  Large slightly irregular left AICA.  Nonvisualization right AICA.  Fetal type origin of the right posterior cerebral artery.  Mild to slightly moderate irregularity of the posterior cerebral artery distal branches bilaterally.  Superior cerebellar artery mild to slightly moderate tandem stenoses bilaterally.  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  2 x 1 mm bulge superior margin of the M1 segment of the right middle cerebral artery. On the MRA angiogram of the neck, this appears to be origin of a vessel rather than aneurysm.  Middle cerebral artery branch vessel irregularity bilaterally.  IMPRESSION: Predominately branch vessel atherosclerotic type changes as noted  above.   Original Report Authenticated By: Fuller Canada, M.D.    Mr Angiogram Neck W Wo Contrast  12/17/2011  *RADIOLOGY REPORT*  Clinical Data:  Diabetic.  Episodes of transient disequilibrium, diplopia and slurred speech with amnesia. History of left neck pain.  Question dissection?  MRA NECK WITHOUT AND WITH CONTRAST  Technique:  Angiographic images of the neck were obtained using MRA technique without and with intravenous contrast.  Carotid stenosis measurements (when applicable) are obtained utilizing NASCET criteria, using the distal internal carotid diameter as the denominator.  Contrast: 15mL MULTIHANCE GADOBENATE DIMEGLUMINE 529 MG/ML IV SOLN  Comparison:  MR angiogram circle Willis 12/16/2011.  MR brain 12/16/2011.  Findings:  Mild irregularity involving portions of the carotid arteries and vertebral arteries without evidence of dissection. Right vertebral artery is dominant.  No evidence of hemodynamically significant stenosis involving either carotid bifurcation or vertebral artery.  Mild narrowing proximal right common carotid artery.  IMPRESSION: Mild irregularity involving portions of the carotid arteries and vertebral arteries without evidence of dissection.  Right vertebral artery is dominant.  No evidence of hemodynamically significant stenosis involving either carotid bifurcation or vertebral artery.  Mild narrowing proximal right common carotid artery.   Original Report Authenticated By: Fuller Canada, M.D.    Mr Brain Wo Contrast  12/16/2011  *RADIOLOGY REPORT*  Clinical Data: Altered mental status  MRI HEAD WITHOUT CONTRAST  Technique:  Multiplanar, multiecho pulse sequences of the brain and surrounding structures were obtained according to standard protocol without intravenous contrast.  Comparison: CT 12/16/2011  Findings: Mild atrophy and minimal chronic microvascular ischemic change in the white matter.  Negative for acute infarct.  Brainstem and cerebellum are normal.   Negative for intracranial hemorrhage.  No mass or edema.  3 mm calcification left medial parietal lobe on the CT is not identified on MRI.  This may be due to chronic infection.  No surrounding edema.  Chronic sinusitis.  Vessels at the base of the brain are patent.  IMPRESSION: Atrophy and minimal chronic microvascular ischemic change in the white matter.  No acute infarct or mass.   Original Report Authenticated By: Camelia Phenes, M.D.     Medications: I have reviewed the patient's current medications. Scheduled Meds:   . atorvastatin  10 mg Oral QHS  . clopidogrel  75 mg Oral Q breakfast  . glimepiride  4 mg Oral QAC breakfast  . heparin  5,000 Units Subcutaneous Q8H  . insulin aspart  0-9 Units Subcutaneous TID WC  . losartan  50 mg Oral Daily  . pantoprazole  40 mg Oral Daily  . DISCONTD: aspirin EC  162 mg Oral Daily  Continuous Infusions:   . sodium chloride 75 mL/hr at 12/16/11 2200  . DISCONTD: sodium chloride 125 mL/hr at 12/16/11 1143   PRN Meds:.acetaminophen, gadobenate dimeglumine, iohexol Assessment/Plan: Patient Active Hospital Problem List: TIA (transient ischemic attack) (12/16/2011)   Assessment: The patient has no findings of CVA on the MRI. MRA is reported however neurology feels that they may need to do further studies to evaluate the posterior circulation. The patient's presentation however is more consistent with likely seizure activity. I will await an opinion from neurology regarding this. EEG is pending at this time.I will also ask physical therapy evaluate the patient.    DM (diabetes mellitus) (12/16/2011)   Assessment: Blood sugars elevated. We'll continue to hold metformin in anticipation of the possible CT angiogram. We'll add low-dose Lantus and nighttime coverage. Continue Amaryl.    HTN (hypertension) (12/16/2011)   Assessment: Blood pressure well-controlled    Focal seizure (12/17/2011)   Assessment: As noted above the patient's presentation is  working with a seizure activity. I will defer to neurology for further recommendations      LOS: 1 day

## 2011-12-17 NOTE — Progress Notes (Signed)
[facility] PROGRESS NOTE    SUBJECTIVE   Patient has some neck discomfort (to me he states on the right side and said it has been going on for about 2-3 weeks and is resolved when he places heat on it or is in the shower) and now recalls possibly having some left leg tingling intermittently that occurred in between the episodes of dizziness. He stood up yesterday and had a episode of feeling off balance but this dissipated quickly.   OBJECTIVE   Vital signs in last 24 hours: Temp:  [97.7 F (36.5 C)-98.7 F (37.1 C)] 98.1 F (36.7 C) (10/16 0956) Pulse Rate:  [71-91] 82  (10/16 0956) Resp:  [14-18] 18  (10/16 0956) BP: (122-142)/(59-86) 134/77 mmHg (10/16 0956) SpO2:  [98 %-100 %] 99 % (10/16 0956) Weight:  [76.658 kg (169 lb)] 76.658 kg (169 lb) (10/15 1824)  Intake/Output from previous day: 10/15 0701 - 10/16 0700 In: 600 [I.V.:600] Out: 700 [Urine:700] Intake/Output this shift: Total I/O In: 240 [P.O.:240] Out: -  Nutritional status: Carb Control  Past Medical History  Diagnosis Date  . Diabetes mellitus without complication   . Shingles     Neurologic ROS negative with exception of above. Musculoskeletal ROS none  Neurologic Exam:  Mental Status: Alert, oriented, thought content appropriate.  Speech fluent without evidence of aphasia.  Able to follow 3 step commands without difficulty. Cranial Nerves: II: Visual fields grossly normal, pupils equal, round, reactive to light and accommodation III,IV, VI: ptosis not present, extra-ocular motions intact bilaterally V,VII: smile symmetric, facial light touch sensation normal bilaterally VIII: hearing normal bilaterally IX,X: gag reflex present XI: bilateral shoulder shrug and neck movements full without meningismus XII: midline tongue extension Motor: Right : Upper extremity   5/5    Left:     Upper extremity   5/5  Lower extremity   5/5     Lower extremity    5/5 Tone and bulk:normal tone throughout; no atrophy noted Sensory: Pinprick and light touch intact throughout, bilaterally Deep Tendon Reflexes: 2+ and symmetric throughout Plantars: Right: downgoing   Left: downgoing Cerebellar: normal finger-to-nose,  normal heel-to-shin test CV: pulses palpable throughout     Lab Results: Lab Results  Component Value Date/Time   CHOL 124 12/17/2011  4:31 AM   Lipid Panel  Basename 12/17/11 0431  CHOL 124  TRIG 118  HDL 35*  CHOLHDL 3.5  VLDL 24  LDLCALC 65    Studies/Results: Dg Chest 2 View  12/16/2011  *RADIOLOGY REPORT*  Clinical Data: Altered mental status.  Nonsmoker.  No chest complaints.  CHEST - 2 VIEW  Comparison: None.  Findings: Heart size top normal.  Mildly tortuous aorta. No infiltrate, congestive heart failure or pneumothorax.  Thoracic kyphosis with degenerative changes.  IMPRESSION: Mildly tortuous aorta.  Top normal heart size.  No infiltrate or congestive heart failure.   Original Report Authenticated By: Fuller Canada, M.D.    Ct Head Wo Contrast  12/16/2011  *RADIOLOGY REPORT*  Clinical Data: Altered mental status  CT HEAD WITHOUT CONTRAST  Technique:  Contiguous axial images were obtained from the base of the skull through the vertex without contrast. Study was obtained within 24 hours of patient arrival at the hospital.  Comparison: None.  Findings:  Ventricles  are normal in size and configuration.  There is no mass, hemorrhage, extra-axial fluid collection, or midline shift.  There is a punctate focus of calcification just to the left of the posterior falx in the medial left parietal lobe which may represent a tiny granuloma.  No other calcifications identified.  Gray-white compartments are normal. There is no evidence of acute infarct.  Bony calvarium appears intact.  The mastoid air cells are clear. There is ethmoid sinus disease bilaterally.  IMPRESSION: Small calcification to the left of the falx in the medial left  parietal lobe.  This finding is of questionable significance. It may represent a small granuloma.  There is ethmoid sinus disease.  Study is otherwise unremarkable.   Original Report Authenticated By: Arvin Collard. WOODRUFF III, M.D.    Mr Brain Wo Contrast  12/16/2011  *RADIOLOGY REPORT*  Clinical Data: Altered mental status  MRI HEAD WITHOUT CONTRAST  Technique:  Multiplanar, multiecho pulse sequences of the brain and surrounding structures were obtained according to standard protocol without intravenous contrast.  Comparison: CT 12/16/2011  Findings: Mild atrophy and minimal chronic microvascular ischemic change in the white matter.  Negative for acute infarct.  Brainstem and cerebellum are normal.  Negative for intracranial hemorrhage.  No mass or edema.  3 mm calcification left medial parietal lobe on the CT is not identified on MRI.  This may be due to chronic infection.  No surrounding edema.  Chronic sinusitis.  Vessels at the base of the brain are patent.  IMPRESSION: Atrophy and minimal chronic microvascular ischemic change in the white matter.  No acute infarct or mass.   Original Report Authenticated By: Camelia Phenes, M.D.   A1c-7.2 LDL- 65 EKG - NSR Echo -PENDING EEG -Pending  Medications:     Scheduled:    . aspirin  324 mg Oral Once  . atorvastatin  10 mg Oral QHS  . clopidogrel  75 mg Oral Q breakfast  . glimepiride  4 mg Oral QAC breakfast  . heparin  5,000 Units Subcutaneous Q8H  . insulin aspart  0-9 Units Subcutaneous TID WC  . losartan  50 mg Oral Daily  . pantoprazole  40 mg Oral Daily  . DISCONTD: aspirin EC  162 mg Oral Daily    Assessment/Plan:    Patient Active Hospital Problem List: TIA (transient ischemic attack) (12/16/2011)   Assessment: Patient continues to have some episodes of feeling off balance when standing up. 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 MRA neck showed  no evidence of hemodynamically limiting stenosis or dissection,however there is a lot of movement artifact. Will obtain CT angiogram of neck to confirm findings.     Plan:  1) Continue Plavix daily 2) Continue with serum blood glucose control--A1c 7.1 3) 2 D echo pending 4) CTA neck to confirm no limiting stenosis or dissection  5) Orthostatic vital signs   Felicie Morn PA-C [facility] [phone]  12/17/2011, 10:23 AM

## 2011-12-17 NOTE — Progress Notes (Signed)
Back from MRI and placed on telemetry monitor

## 2011-12-17 NOTE — Progress Notes (Signed)
Offsite EEG completed at [facility]. 

## 2011-12-17 NOTE — Progress Notes (Signed)
Inpatient Diabetes Program Recommendations  AACE/ADA: New Consensus Statement on Inpatient Glycemic Control (2013)  Target Ranges:  Prepandial:   less than 140 mg/dL      Peak postprandial:   less than 180 mg/dL (1-2 hours)      Critically ill patients:  140 - 180 mg/dL   Reason for Visit: Hyperglycemia  Gavin [name] is a 66 y.o. male with pmh of DM, HTN, HLD and GERD; came to the ED with complaints of acute episode of dysarthria, LE numbness, unsteady gait and spacing off episodes.  Eating well at present.  Prior to Admission medications     Medication  Sig  Start Date  End Date  Taking?  Authorizing Provider   aspirin 81 MG tablet  Take 81 mg by mouth daily.      Yes  Historical Provider, MD   atorvastatin (LIPITOR) 10 MG tablet  Take 10 mg by mouth daily.      Yes  Historical Provider, MD   glimepiride (AMARYL) 4 MG tablet  Take 4 mg by mouth daily before breakfast.      Yes  Historical Provider, MD   losartan (COZAAR) 50 MG tablet  Take 50 mg by mouth daily.      Yes  Historical Provider, MD   metFORMIN (GLUCOPHAGE) 850 MG tablet  Take 850 mg by mouth 3 (three) times daily.      Yes  Historical Provider, MD   omeprazole (PRILOSEC) 40 MG capsule  Take 40 mg by mouth daily.      Yes  Historical Provider, MD   solifenacin (VESICARE) 5 MG tablet  Take 5 mg by mouth daily.      Yes  Historical Provider, MD   tadalafil (CIALIS) 20 MG tablet  Take 20 mg by mouth daily as needed. Erectile dysfunction      Yes  Historical Provider, MD     Results for BRYIAN, RICHMAN (MRN [id number]) as of 12/17/2011 15:39  Ref. Range 12/16/2011 10:51 12/16/2011 18:51 12/16/2011 21:47 12/17/2011 08:02 12/17/2011 11:59  Glucose-Capillary Latest Range: 70-99 mg/dL 409 (H) 811 (H) 914 (H) 196 (H) 241 (H)  Results for BRANNON, DAMP (MRN [id number]) as of 12/17/2011 15:39  Ref. Range 12/16/2011 18:55  Hemoglobin A1C Latest Range: <5.7 % 7.2 (H)   Results for DEONTRAE, FOLINO (MRN [id number]) as of 12/17/2011  15:39  Ref. Range 12/16/2011 11:10  Sodium Latest Range: 135-145 mEq/L 135  Potassium Latest Range: 3.5-5.1 mEq/L 4.1  Chloride Latest Range: 96-112 mEq/L 100  CO2 Latest Range: 19-32 mEq/L 21  BUN Latest Range: 6-23 mg/dL 15  Creatinine Latest Range: 0.50-1.35 mg/dL 6.29  Calcium Latest Range: 8.4-10.5 mg/dL 9.7  GFR calc non Af Amer Latest Range: >90 mL/min >90  GFR calc Af Amer Latest Range: >90 mL/min >90  Glucose Latest Range: 70-99 mg/dL 528 (H)  Alkaline Phosphatase Latest Range: 39-117 U/L 81  Albumin Latest Range: 3.5-5.2 g/dL 4.0  AST Latest Range: 0-37 U/L 22  ALT Latest Range: 0-53 U/L 27  Total Protein Latest Range: 6.0-8.3 g/dL 6.6  Total Bilirubin Latest Range: 0.3-1.2 mg/dL 0.3   Post-prandial blood sugars elevated. Would benefit from tighter glycemic control.  Recommendations:  May benefit from addition of meal coverage insulin - Novolog 3 units tidwc if pt eats >50% meals. OP Diabetes Education consult for HgbA1C >6.5%.  Will follow.  Thank you.  Ailene Ards, RD, LDN, CDE Inpatient Diabetes Coordinator

## 2011-12-17 NOTE — Progress Notes (Signed)
*  PRELIMINARY RESULTS* Echocardiogram 2D Echocardiogram has been performed.  Gavin [name] 12/17/2011, 2:18 PM

## 2011-12-17 NOTE — Progress Notes (Signed)
Per Neurologist, Noel Christmas.  Patient is cleared for discharge.  Waiting on results of 2D echo.

## 2011-12-18 LAB — GLUCOSE, CAPILLARY: Glucose-Capillary: 221 mg/dL — ABNORMAL HIGH (ref 70–99)

## 2011-12-18 MED ORDER — CLOPIDOGREL BISULFATE 75 MG PO TABS: 75.0000 mg | ORAL_TABLET | Freq: Every day | ORAL | Status: DC

## 2011-12-18 MED ORDER — METFORMIN HCL 850 MG PO TABS: 850.0000 mg | ORAL_TABLET | Freq: Three times a day (TID) | ORAL | Status: DC

## 2011-12-18 NOTE — Procedures (Signed)
EEG NUMBER:  [id number]  REFERRING PHYSICIAN:  Rosanna Randy, MD  INDICATIONS FOR STUDY:  A 66 year old man who presented with left upper extremity numbness as well as confusion and slurred speech, and amnesia for 1 hour.  Study is being performed to rule out possible focal seizure disorder.  DESCRIPTION:  This is a routine EEG recording performed during wakefulness and during sleep.  Predominant background activity during wakefulness consisted of 10 Hz symmetrical alpha rhythm, which attenuated well with eye opening as well as alerting procedures.  Photic stimulation produced symmetrical occipital driving response. Hyperventilation was not performed.  During sleep, symmetrical vertex waves, sleep spindles, and K complexes were recorded.  No epileptiform discharges were recorded.  There were no areas of abnormal slowing.  INTERPRETATION:  This is a normal EEG recording during wakefulness and during sleep.  No evidence of an epileptic disorder was demonstrated.     Noel Christmas, MD    [id number] D:  12/17/2011 13:47:38  T:  12/18/2011 05:57:51  Job #:  [id number]

## 2011-12-18 NOTE — Progress Notes (Signed)
PHYSICAL THERAPY NOTE--Pt. Reports he just walked around with RN with no difficulties and declines need for PT at this time. PT signing off. Blanchard Kelch PT .[phone]

## 2011-12-18 NOTE — Discharge Summary (Signed)
Gavin [name] MRN: [id number] DOB/AGE: 08-09-1945 66 y.o.  Admit date: 12/16/2011 Discharge date: 12/18/2011  Primary Care Physician:  Laurell Josephs, MD   Discharge Diagnoses:   Patient Active Problem List  Diagnosis  . TIA (transient ischemic attack)  . DM (diabetes mellitus)  . HLD (hyperlipidemia)  . HTN (hypertension)  . GERD (gastroesophageal reflux disease)  . Focal seizure    DISCHARGE MEDICATION:   Medication List     As of 12/18/2011 10:24 AM    STOP taking these medications         aspirin 81 MG tablet      TAKE these medications         atorvastatin 10 MG tablet   Commonly known as: LIPITOR   Take 10 mg by mouth daily.      clopidogrel 75 MG tablet   Commonly known as: PLAVIX   Take 1 tablet (75 mg total) by mouth daily with breakfast.      glimepiride 4 MG tablet   Commonly known as: AMARYL   Take 4 mg by mouth daily before breakfast.      losartan 50 MG tablet   Commonly known as: COZAAR   Take 50 mg by mouth daily.      metFORMIN 850 MG tablet   Commonly known as: GLUCOPHAGE   Take 1 tablet (850 mg total) by mouth 3 (three) times daily. Resume on Saturday 12/20/2011 secondary to contrast study performed on  12/17/2011.      omeprazole 40 MG capsule   Commonly known as: PRILOSEC   Take 40 mg by mouth daily.      solifenacin 5 MG tablet   Commonly known as: VESICARE   Take 5 mg by mouth daily.      tadalafil 20 MG tablet   Commonly known as: CIALIS   Take 20 mg by mouth daily as needed. Erectile dysfunction          Consults:     SIGNIFICANT DIAGNOSTIC STUDIES:  Dg Chest 2 View  12/16/2011  *RADIOLOGY REPORT*  Clinical Data: Altered mental status.  Nonsmoker.  No chest complaints.  CHEST - 2 VIEW  Comparison: None.  Findings: Heart size top normal.  Mildly tortuous aorta. No infiltrate, congestive heart failure or pneumothorax.  Thoracic kyphosis with degenerative changes.  IMPRESSION: Mildly tortuous aorta.  Top normal heart  size.  No infiltrate or congestive heart failure.   Original Report Authenticated By: Fuller Canada, M.D.    Ct Head Wo Contrast  12/16/2011  *RADIOLOGY REPORT*  Clinical Data: Altered mental status  CT HEAD WITHOUT CONTRAST  Technique:  Contiguous axial images were obtained from the base of the skull through the vertex without contrast. Study was obtained within 24 hours of patient arrival at the hospital.  Comparison: None.  Findings:  Ventricles are normal in size and configuration.  There is no mass, hemorrhage, extra-axial fluid collection, or midline shift.  There is a punctate focus of calcification just to the left of the posterior falx in the medial left parietal lobe which may represent a tiny granuloma.  No other calcifications identified.  Gray-white compartments are normal. There is no evidence of acute infarct.  Bony calvarium appears intact.  The mastoid air cells are clear. There is ethmoid sinus disease bilaterally.  IMPRESSION: Small calcification to the left of the falx in the medial left parietal lobe.  This finding is of questionable significance. It may represent a small granuloma.  There is ethmoid sinus  disease.  Study is otherwise unremarkable.   Original Report Authenticated By: Arvin Collard. WOODRUFF III, M.D.    Ct Angio Neck W/cm &/or Wo/cm  12/17/2011  *RADIOLOGY REPORT*  Clinical Data:  Neck discomfort. Transient episode of imbalance. Possible left leg tingling intermittently.  CT ANGIOGRAPHY NECK  Technique:  Multidetector CT imaging of the neck was performed using the standard protocol during bolus administration of intravenous contrast.  Multiplanar CT image reconstructions including MIPs were obtained to evaluate the vascular anatomy. Carotid stenosis measurements (when applicable) are obtained utilizing NASCET criteria, using the distal internal carotid diameter as the denominator.  Contrast: OMNIPAQUE IOHEXOL 350 MG/ML SOLN  Comparison:   MRA neck without and with  contrast 12/17/2011.  Findings:  There is conventional branching great vessels and arch. There is no proximal flow reducing lesion.  There is minimal nonstenotic atherosclerotic calcification of both carotid bifurcations.  The cervical internal carotid arteries widely patent without dissection or fibromuscular disease.  Both vertebral arteries are patent with the right slightly larger. Both contribute to formation of the basilar.  There is no focal dissection or narrowing.  Moderate cervical spondylosis is present with disc space narrowing at C5-6, C6-7, and C7-T1.  No neck masses are seen.  Lung apices grossly clear.  Trachea midline.  Thyroid gland unremarkable.  No destructive osseous lesions.  Visualized intracranial compartment negative.   Review of the MIP images confirms the above findings.  IMPRESSION: Unremarkable CT angiography of the neck.  No proximal flow reducing lesion or dissection.  Good general agreement with earlier MRA neck.  Cervical spondylosis with disc space narrowing C5-6, C6-7, and C7- T1.   Original Report Authenticated By: Elsie Stain, M.D.    Mr [facility] Wo Contrast  12/17/2011  *RADIOLOGY REPORT*  Clinical Data: Posterior circulation assessment.  Concern for TIA.  MRA HEAD WITHOUT CONTRAST  Technique: Angiographic images of the Circle of Willis were obtained using MRA technique without intravenous contrast.  Comparison: MR brain 12/16/2011.  Findings: Right vertebral artery is dominant.  Minimal irregularity of the distal left vertebral artery beyond the takeoff of the left PICA.  Moderate narrowing and irregularity of the left PICA.  This is at the resolution of the present technique.  Ectatic basilar artery without significant stenosis or irregularity.  Large slightly irregular left AICA.  Nonvisualization right AICA.  Fetal type origin of the right posterior cerebral artery.  Mild to slightly moderate irregularity of the posterior cerebral artery distal branches bilaterally.   Superior cerebellar artery mild to slightly moderate tandem stenoses bilaterally.  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  2 x 1 mm bulge superior margin of the M1 segment of the right middle cerebral artery. On the MRA angiogram of the neck, this appears to be origin of a vessel rather than aneurysm.  Middle cerebral artery branch vessel irregularity bilaterally.  IMPRESSION: Predominately branch vessel atherosclerotic type changes as noted above.   Original Report Authenticated By: Fuller Canada, M.D.    Mr Angiogram Neck W Wo Contrast  12/17/2011  *RADIOLOGY REPORT*  Clinical Data:  Diabetic.  Episodes of transient disequilibrium, diplopia and slurred speech with amnesia. History of left neck pain.  Question dissection?  MRA NECK WITHOUT AND WITH CONTRAST  Technique:  Angiographic images of the neck were obtained using MRA technique without and with intravenous contrast.  Carotid stenosis measurements (when applicable) are obtained utilizing NASCET criteria, using the distal internal carotid diameter as the denominator.  Contrast: 15mL  MULTIHANCE GADOBENATE DIMEGLUMINE 529 MG/ML IV SOLN  Comparison:  MR angiogram circle Willis 12/16/2011.  MR brain 12/16/2011.  Findings:  Mild irregularity involving portions of the carotid arteries and vertebral arteries without evidence of dissection. Right vertebral artery is dominant.  No evidence of hemodynamically significant stenosis involving either carotid bifurcation or vertebral artery.  Mild narrowing proximal right common carotid artery.  IMPRESSION: Mild irregularity involving portions of the carotid arteries and vertebral arteries without evidence of dissection.  Right vertebral artery is dominant.  No evidence of hemodynamically significant stenosis involving either carotid bifurcation or vertebral artery.  Mild narrowing proximal right common carotid artery.   Original Report Authenticated By: Fuller Canada, M.D.    Mr Brain Wo  Contrast  12/16/2011  *RADIOLOGY REPORT*  Clinical Data: Altered mental status  MRI HEAD WITHOUT CONTRAST  Technique:  Multiplanar, multiecho pulse sequences of the brain and surrounding structures were obtained according to standard protocol without intravenous contrast.  Comparison: CT 12/16/2011  Findings: Mild atrophy and minimal chronic microvascular ischemic change in the white matter.  Negative for acute infarct.  Brainstem and cerebellum are normal.  Negative for intracranial hemorrhage.  No mass or edema.  3 mm calcification left medial parietal lobe on the CT is not identified on MRI.  This may be due to chronic infection.  No surrounding edema.  Chronic sinusitis.  Vessels at the base of the brain are patent.  IMPRESSION: Atrophy and minimal chronic microvascular ischemic change in the white matter.  No acute infarct or mass.   Original Report Authenticated By: Camelia Phenes, M.D.      ECHO:   Left ventricle: The cavity size was normal. Wall thickness was increased in a pattern of moderate LVH. Systolic function was mildly reduced. The estimated ejection fraction was in the range of 45% to 50%. Regional wall motion abnormalities cannot be excluded. Doppler parameters are consistent with abnormal left ventricular relaxation (grade 1 diastolic dysfunction).  Impressions:  - No cardiac source of emboli was indentified.    CARDIAC CATH & OTHER PROCEDURES: EEG: INTERPRETATION: This is a normal EEG recording during wakefulness and  during sleep. No evidence of an epileptic disorder was demonstrated.    BRIEF ADMITTING H & P: Patient is a 66 y.o. male with pmh of DM, HTN, HLD and GERD; came to the ED with complaints of acute episode of dysarthria, LE numbness, dysequilibrium and staring episodes. Patient reports he had experience a total of 3 episodes in the last 4 weeks; symptoms has lasted up to 2 hours and then resolved on their own. Patient denies any fever, CP, palpitation,  cough, SOB, nausea, vomiting, melena, HA's or any other complaints.  He reports having some Diplopia especially with far sight.  Of note, there is the possibility of his symptoms appearing after or around the use of sleeping aids; happened once with ambien and the drug was discontinue. Currently using PRN OTC sleeping aids.       Hospital Course:  Present on Admission:  .TIA (transient ischemic attack):  Pt was admitted with the above symptoms. He had dysequilibrium present on admission. He was seen by Neurology and considered in the differential were focal seizures versus TIA with a concern for posterior circulation insufficiency and possible vertebral artery dissection.  CT angiography of the neck showed no evidence of stenosis or dissection. Symptoms resolved completely and pt was back to his baseline at time of discharge. Risk was evaluated and pt was found to have an  elevated Hb A1c and a low HDL but normal LDL and VLDL.    Antiplatelet therapy was escalated to Plavix. It is the final recommendation from Neurology  that these episodes represented TIA's and not seizure activity. No anti-epileptic medications were initiated. Pt and wife were educated the warning signs of CVA, and the importance of timely presentation. PT was consulted for evaluation of functional needs but patient refused PT evaluation. He did ambulate around Unit independently with Nursing staff. Pt was in good condition at the time of discharge.  .DM II: Pt had an elevated Hb A1c which reflects poor long term control. Pt will likely benefit from further titration of medications and possibly addition of insulin for mealtime coverage. Nevertheless, metformin is being held for 48 hours after contrast study in the interest of preservation of kidney function. This has been communicated to patient and wife.  Marland KitchenHTN (hypertension): BP not at goal for diabetes. Will require further titration of medications.   Disposition and Follow-up:    Pt is being discharged home in good condition. He is to follow up with his PMD within 1 week.     Discharge Orders    Future Orders Please Complete By Expires   Diet - low sodium heart healthy      Diet Carb Modified      Activity as tolerated - No restrictions         DISCHARGE EXAM:  General: Alert, awake, oriented x3, in no acute distress.  Vital Signs:Blood pressure 141/73, pulse 77, temperature 98.5 F (36.9 C), temperature source Oral, resp. rate 20, height 5\' 6"  (1.676 m), weight 76.658 kg (169 lb), SpO2 97.00%. Heart: Regular rate and rhythm, without murmurs, rubs, gallops.  Lungs: Clear to auscultation, no wheezing or rhonchi noted. No increased vocal fremitus resonant to percussion  Abdomen: Soft, nontender, nondistended, positive bowel sounds, no masses no hepatosplenomegaly noted..  Neuro: No focal neurological deficits noted cranial nerves II through XII grossly intact. DTRs 2+ bilaterally upper and lower extremities. Strength 5/5 in bilateral upper and lower extremities.  Musculoskeletal: No warm swelling or erythema around joints, no spinal tenderness noted.      Basename 12/16/11 1855 12/16/11 1110  NA -- 135  K -- 4.1  CL -- 100  CO2 -- 21  GLUCOSE -- 261*  BUN -- 15  CREATININE 0.81 0.81  CALCIUM -- 9.7  MG -- --  PHOS -- --    Basename 12/16/11 1110  AST 22  ALT 27  ALKPHOS 81  BILITOT 0.3  PROT 6.6  ALBUMIN 4.0   No results found for this basename: LIPASE:2,AMYLASE:2 in the last 72 hours  Basename 12/16/11 1855 12/16/11 1110  WBC 8.9 7.8  NEUTROABS -- 4.3  HGB 16.3 16.3  HCT 44.4 44.7  MCV 86.2 86.0  PLT 165 170   Total time for discharge including decision making and face to face time greater than 30 minutes.  Signed: Quentez Lober A. 12/18/2011, 10:24 AM

## 2012-03-30 ENCOUNTER — Other Ambulatory Visit: Payer: Self-pay | Admitting: Family Medicine

## 2012-03-30 DIAGNOSIS — Z136 Encounter for screening for cardiovascular disorders: Secondary | ICD-10-CM

## 2012-04-07 ENCOUNTER — Ambulatory Visit
Admission: RE | Admit: 2012-04-07 | Discharge: 2012-04-07 | Disposition: A | Discharge status: Home / Self Care | Payer: Medicare Other | Source: Ambulatory Visit | Attending: Family Medicine | Admitting: Family Medicine

## 2012-04-07 DIAGNOSIS — Z136 Encounter for screening for cardiovascular disorders: Secondary | ICD-10-CM

## 2014-06-20 ENCOUNTER — Ambulatory Visit
Admission: RE | Admit: 2014-06-20 | Discharge: 2014-06-20 | Disposition: A | Discharge status: Home / Self Care | Payer: Medicare Other | Source: Ambulatory Visit | Attending: Family Medicine | Admitting: Family Medicine

## 2014-06-20 ENCOUNTER — Other Ambulatory Visit: Payer: Self-pay | Admitting: Family Medicine

## 2014-06-20 DIAGNOSIS — R109 Unspecified abdominal pain: Secondary | ICD-10-CM

## 2014-12-12 ENCOUNTER — Other Ambulatory Visit: Payer: Self-pay | Admitting: Family Medicine

## 2014-12-12 ENCOUNTER — Ambulatory Visit
Admission: RE | Admit: 2014-12-12 | Discharge: 2014-12-12 | Disposition: A | Discharge status: Home / Self Care | Payer: Medicare Other | Source: Ambulatory Visit | Attending: Family Medicine | Admitting: Family Medicine

## 2014-12-12 DIAGNOSIS — R059 Cough, unspecified: Secondary | ICD-10-CM

## 2014-12-12 DIAGNOSIS — R05 Cough: Secondary | ICD-10-CM

## 2014-12-14 ENCOUNTER — Encounter: Payer: Self-pay | Admitting: Nurse Practitioner

## 2014-12-18 ENCOUNTER — Ambulatory Visit (INDEPENDENT_AMBULATORY_CARE_PROVIDER_SITE_OTHER): Payer: Medicare Other | Admitting: Nurse Practitioner

## 2014-12-18 ENCOUNTER — Encounter: Payer: Self-pay | Admitting: Nurse Practitioner

## 2014-12-18 VITALS — BP 140/78 | HR 91 | Ht 65.0 in | Wt 161.4 lb

## 2014-12-18 DIAGNOSIS — R0600 Dyspnea, unspecified: Secondary | ICD-10-CM | POA: Diagnosis not present

## 2014-12-18 LAB — CBC
HCT: 48.5 % (ref 39.0–52.0)
Hemoglobin: 16.5 g/dL (ref 13.0–17.0)
MCH: 29.4 pg (ref 26.0–34.0)
MCHC: 34 g/dL (ref 30.0–36.0)
MCV: 86.3 fL (ref 78.0–100.0)
MPV: 9.7 fL (ref 8.6–12.4)
Platelets: 164 10*3/uL (ref 150–400)
RBC: 5.62 MIL/uL (ref 4.22–5.81)
RDW: 14 % (ref 11.5–15.5)
WBC: 7.9 10*3/uL (ref 4.0–10.5)

## 2014-12-18 LAB — BASIC METABOLIC PANEL
BUN: 18 mg/dL (ref 7–25)
CO2: 23 mmol/L (ref 20–31)
Calcium: 10.4 mg/dL — ABNORMAL HIGH (ref 8.6–10.3)
Chloride: 103 mmol/L (ref 98–110)
Creat: 0.99 mg/dL (ref 0.70–1.25)
Glucose, Bld: 270 mg/dL — ABNORMAL HIGH (ref 65–99)
Potassium: 4.1 mmol/L (ref 3.5–5.3)
Sodium: 138 mmol/L (ref 135–146)

## 2014-12-18 NOTE — Patient Instructions (Addendum)
We will be checking the following labs today - BMET, BNP and CBC   Medication Instructions:    Continue with your current medicines.     Testing/Procedures To Be Arranged:  Echocardiogram  Lexiscan Myoview  Follow-Up:   We will determine your follow up once we get your test results    Other Special Instructions:   N/A  Call the [facility] office at [phone] if you have any questions, problems or concerns.

## 2014-12-18 NOTE — Progress Notes (Signed)
CARDIOLOGY OFFICE NOTE  Date:  12/18/2014    Gavin [name] Date of Birth: [birth year] Medical Record #[id number]  PCP:  Laurell Josephs, MD  Cardiologist:  Johney Frame (DOD)    Chief Complaint  Patient presents with  . Shortness of Breath    New patient visit - seen for Dr. Johney Frame (DOD)    History of Present Illness: Gavin [name] is a 69 y.o. male who presents today for a new patient visit. Seen for Dr. Johney Frame (DOD).   He has a history of DM. Former smoker. Has ED, HTN and HLD as well. Chronic microvascular changes on past CT/MRI head - saw neurology - pt noted to have stopped aspirin/Plavix.   Referred by PCP for a several month history of DOE. He got a call from nurse from [facility] - told her he had difficulty swallowing and at times SOB, also waking up at night with shortness of breath. Saw PCP earlier this month. Referred here and to GI. Negative CXR.   Comes in today. Here alone. Actually short of breath for over a year or more. Says it has been more progressive. He is no longer able to walk up steps due to dyspnea. He will be short of breath if he is carrying heavy loads. No real exercise. Has "bad knees". 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 Does not have any chest discomfort. Tells me that he does not feel like he had a stroke 3 years ago - thought he had mistakenly taken a sleeping pill - EF by echo then was 45%. No palpitations. Multiple CV risk factors. Family died mostly of "old age".  Past Medical History  Diagnosis Date  . Diabetes mellitus without complication (HCC)   . Shingles   . HLD (hyperlipidemia)     Past Surgical History  Procedure Laterality Date  . No past surgeries       Medications: Current Outpatient Prescriptions  Medication Sig Dispense Refill  . aspirin 81 MG tablet Take 81 mg by mouth daily.    Marland Kitchen atorvastatin (LIPITOR) 10 MG tablet Take 10 mg by mouth daily.    . Dapagliflozin Propanediol (FARXIGA PO) Take 5 mg by mouth daily.      . Dapagliflozin-Metformin HCl (XIGDUO XR PO) Take 5 mg by mouth daily.    . finasteride (PROSCAR) 5 MG tablet Take 5 mg by mouth daily.     Marland Kitchen glimepiride (AMARYL) 4 MG tablet Take 4 mg by mouth daily before breakfast.    . losartan (COZAAR) 50 MG tablet Take 50 mg by mouth daily.    . metFORMIN (GLUCOPHAGE) 850 MG tablet Take 1 tablet (850 mg total) by mouth 3 (three) times daily. Resume on Saturday 12/20/2011 secondary to contrast study performed on  12/17/2011.    . solifenacin (VESICARE) 5 MG tablet Take 5 mg by mouth daily.    . Vitamins-Lipotropics (LIPO-FLAVONOID PLUS PO) Take 2 tablets by mouth daily.    Marland Kitchen omeprazole (PRILOSEC) 40 MG capsule Take 40 mg by mouth daily.    . tadalafil (CIALIS) 20 MG tablet Take 20 mg by mouth daily as needed. Erectile dysfunction     No current facility-administered medications for this visit.    Allergies: Allergies  Allergen Reactions  . Altace [Ramipril] Anaphylaxis  . Cortizone-10 [Hydrocortisone] Other (See Comments)    Vision problems    Social History: The patient  reports that he has quit smoking. He has never used smokeless tobacco. He reports that he  drinks alcohol. He reports that he does not use illicit drugs.   Family History: The patient's family history includes Broken bones in his mother; Diabetes in his father.   Review of Systems: Please see the history of present illness.   Otherwise, the review of systems is positive for none.   All other systems are reviewed and negative.   Physical Exam: VS:  BP 140/78 mmHg  Pulse 91  Ht 5\' 5"  (1.651 m)  Wt 161 lb 6.4 oz (73.211 kg)  BMI 26.86 kg/m2  SpO2 96% .  BMI Body mass index is 26.86 kg/(m^2).  Wt Readings from Last 3 Encounters:  12/18/14 161 lb 6.4 oz (73.211 kg)  12/16/11 169 lb (76.658 kg)    General: Pleasant. Well developed, well nourished and in no acute distress.  HEENT: Normal. Neck: Supple, no JVD, carotid bruits, or masses noted.  Cardiac: Regular rate and  rhythm. No murmurs, rubs, or gallops. No edema.  Respiratory:  Lungs are clear to auscultation bilaterally with normal work of breathing.  GI: Soft and nontender.  MS: No deformity or atrophy. Gait and ROM intact. Skin: Warm and dry. Color is normal.  Neuro:  Strength and sensation are intact and no gross focal deficits noted.  Psych: Alert, appropriate and with normal affect.   LABORATORY DATA:  EKG:  EKG is ordered today. This demonstrates NSR, septal infarct and LAE - reviewed with Dr. Johney FrameAllred.  Lab Results  Component Value Date   WBC 8.9 12/16/2011   HGB 16.3 12/16/2011   HCT 44.4 12/16/2011   PLT 165 12/16/2011   GLUCOSE 261* 12/16/2011   CHOL 124 12/17/2011   TRIG 118 12/17/2011   HDL 35* 12/17/2011   LDLCALC 65 12/17/2011   ALT 27 12/16/2011   AST 22 12/16/2011   NA 135 12/16/2011   K 4.1 12/16/2011   CL 100 12/16/2011   CREATININE 0.81 12/16/2011   BUN 15 12/16/2011   CO2 21 12/16/2011   TSH 1.526 12/16/2011   HGBA1C 7.2* 12/16/2011    BNP (last 3 results) No results for input(s): BNP in the last 8760 hours.  ProBNP (last 3 results) No results for input(s): PROBNP in the last 8760 hours.   Other Studies Reviewed Today:  ECHO: Left ventricle: The cavity size was normal. Wall thickness was increased in a pattern of moderate LVH. Systolic function was mildly reduced. The estimated ejection fraction was in the range of 45% to 50%. Regional wall motion abnormalities cannot be excluded. Doppler parameters are consistent with abnormal left ventricular relaxation (grade 1 diastolic dysfunction).  Impressions:  - No cardiac source of emboli was indentified.  Assessment/Plan: 1. Progressive shortness of breath - multiple CV risk factors (gender, HTN, HLD and DM) and has abnormal EKG with concern for prior septal infarct and prior abnormal echo. Discussed with Dr. Johney FrameAllred - will update echo and arrange for Lexiscan (hope to do walking  Lexiscan). Further disposition to follow.  2. HTN - says he has better control at home  3 HLD - on statin therapy  4. DM - tells me A1C is currently 7.4  5. Mild LV dysfunction - update the echo.   Current medicines are reviewed with the patient today.  The patient does not have concerns regarding medicines other than what has been noted above.  The following changes have been made:  See above.  Labs/ tests ordered today include:    Orders Placed This Encounter  Procedures  . Brain natriuretic peptide  .  Basic metabolic panel  . CBC  . EKG 12-Lead     Disposition:   Further disposition to follow  Patient is agreeable to this plan and will call if any problems develop in the interim.   Signed: Rosalio Macadamia, RN, ANP-C 12/18/2014 11:15 AM  [facility] [address] [place], [place]  [address] Phone: [phone] Fax: [phone]

## 2014-12-19 LAB — BRAIN NATRIURETIC PEPTIDE: Brain Natriuretic Peptide: 6.1 pg/mL (ref 0.0–100.0)

## 2014-12-27 ENCOUNTER — Telehealth (HOSPITAL_COMMUNITY): Payer: Self-pay | Admitting: *Deleted

## 2014-12-27 NOTE — Telephone Encounter (Signed)
Patient given detailed instructions per Myocardial Perfusion Study Information Sheet for the test on 01/01/15 at 745. Patient notified to arrive 15 minutes early and that it is imperative to arrive on time for appointment to keep from having the test rescheduled.  If you need to cancel or reschedule your appointment, please call the office within 24 hours of your appointment. Failure to do so may result in a cancellation of your appointment, and a $50 no show fee. Patient verbalized understanding.Gavin CharMary J Artia Singley, RN

## 2015-01-01 ENCOUNTER — Ambulatory Visit (HOSPITAL_BASED_OUTPATIENT_CLINIC_OR_DEPARTMENT_OTHER): Payer: Medicare Other

## 2015-01-01 ENCOUNTER — Ambulatory Visit (HOSPITAL_COMMUNITY): Payer: Medicare Other | Attending: Internal Medicine

## 2015-01-01 ENCOUNTER — Other Ambulatory Visit: Payer: Self-pay | Admitting: Nurse Practitioner

## 2015-01-01 ENCOUNTER — Other Ambulatory Visit: Payer: Self-pay

## 2015-01-01 DIAGNOSIS — I340 Nonrheumatic mitral (valve) insufficiency: Secondary | ICD-10-CM | POA: Insufficient documentation

## 2015-01-01 DIAGNOSIS — E119 Type 2 diabetes mellitus without complications: Secondary | ICD-10-CM | POA: Insufficient documentation

## 2015-01-01 DIAGNOSIS — Z87891 Personal history of nicotine dependence: Secondary | ICD-10-CM | POA: Diagnosis not present

## 2015-01-01 DIAGNOSIS — R0609 Other forms of dyspnea: Secondary | ICD-10-CM | POA: Insufficient documentation

## 2015-01-01 DIAGNOSIS — R0600 Dyspnea, unspecified: Secondary | ICD-10-CM

## 2015-01-01 DIAGNOSIS — I071 Rheumatic tricuspid insufficiency: Secondary | ICD-10-CM | POA: Diagnosis not present

## 2015-01-01 DIAGNOSIS — I517 Cardiomegaly: Secondary | ICD-10-CM | POA: Diagnosis not present

## 2015-01-01 DIAGNOSIS — I10 Essential (primary) hypertension: Secondary | ICD-10-CM | POA: Diagnosis not present

## 2015-01-01 DIAGNOSIS — E785 Hyperlipidemia, unspecified: Secondary | ICD-10-CM | POA: Insufficient documentation

## 2015-01-01 LAB — MYOCARDIAL PERFUSION IMAGING
LV dias vol: 83 mL
LV sys vol: 34 mL
Peak HR: 100 {beats}/min
RATE: 0.29
Rest HR: 70 {beats}/min
SDS: 5
SRS: 4
SSS: 9
TID: 1.04

## 2015-01-01 MED ORDER — REGADENOSON 0.4 MG/5ML IV SOLN
0.4000 mg | Freq: Once | INTRAVENOUS | Status: AC
  Administered 2015-01-01: 0.4 mg via INTRAVENOUS

## 2015-01-01 MED ORDER — TECHNETIUM TC 99M SESTAMIBI GENERIC - CARDIOLITE
10.9000 | Freq: Once | INTRAVENOUS | Status: AC | PRN
  Administered 2015-01-01: 10.9 via INTRAVENOUS

## 2015-01-01 MED ORDER — TECHNETIUM TC 99M SESTAMIBI GENERIC - CARDIOLITE
31.7000 | Freq: Once | INTRAVENOUS | Status: AC | PRN
  Administered 2015-01-01: 31.7 via INTRAVENOUS

## 2016-01-02 ENCOUNTER — Other Ambulatory Visit: Payer: Self-pay | Admitting: Endocrinology

## 2016-01-02 ENCOUNTER — Other Ambulatory Visit: Payer: Self-pay | Admitting: Cardiology

## 2016-01-02 DIAGNOSIS — R1032 Left lower quadrant pain: Secondary | ICD-10-CM

## 2016-01-07 ENCOUNTER — Ambulatory Visit
Admission: RE | Admit: 2016-01-07 | Discharge: 2016-01-07 | Disposition: A | Discharge status: Home / Self Care | Payer: Medicare Other | Source: Ambulatory Visit | Attending: Endocrinology | Admitting: Endocrinology

## 2016-01-07 DIAGNOSIS — R1032 Left lower quadrant pain: Secondary | ICD-10-CM

## 2016-05-29 ENCOUNTER — Other Ambulatory Visit: Payer: Self-pay | Admitting: Family Medicine

## 2016-05-29 DIAGNOSIS — R1312 Dysphagia, oropharyngeal phase: Secondary | ICD-10-CM

## 2016-06-04 ENCOUNTER — Ambulatory Visit
Admission: RE | Admit: 2016-06-04 | Discharge: 2016-06-04 | Disposition: A | Discharge status: Home / Self Care | Payer: Medicare Other | Source: Ambulatory Visit | Attending: Family Medicine | Admitting: Family Medicine

## 2016-06-04 DIAGNOSIS — R1312 Dysphagia, oropharyngeal phase: Secondary | ICD-10-CM

## 2016-09-25 ENCOUNTER — Ambulatory Visit (HOSPITAL_COMMUNITY)
Admission: RE | Admit: 2016-09-25 | Discharge: 2016-09-25 | Disposition: A | Discharge status: Home / Self Care | Payer: Medicare Other | Source: Ambulatory Visit | Attending: Vascular Surgery | Admitting: Vascular Surgery

## 2016-09-25 ENCOUNTER — Other Ambulatory Visit (HOSPITAL_COMMUNITY): Payer: Self-pay | Admitting: Endocrinology

## 2016-09-25 ENCOUNTER — Encounter (HOSPITAL_COMMUNITY): Payer: Self-pay

## 2016-09-25 DIAGNOSIS — I739 Peripheral vascular disease, unspecified: Secondary | ICD-10-CM

## 2016-10-03 ENCOUNTER — Other Ambulatory Visit (HOSPITAL_COMMUNITY): Payer: Self-pay | Admitting: Endocrinology

## 2016-10-03 ENCOUNTER — Ambulatory Visit (HOSPITAL_COMMUNITY)
Admission: RE | Admit: 2016-10-03 | Discharge: 2016-10-03 | Disposition: A | Discharge status: Home / Self Care | Payer: Medicare Other | Source: Ambulatory Visit | Attending: Vascular Surgery | Admitting: Vascular Surgery

## 2016-10-03 DIAGNOSIS — M79604 Pain in right leg: Secondary | ICD-10-CM

## 2016-11-13 ENCOUNTER — Ambulatory Visit (INDEPENDENT_AMBULATORY_CARE_PROVIDER_SITE_OTHER): Payer: Self-pay | Admitting: Neurology

## 2016-11-13 ENCOUNTER — Ambulatory Visit (INDEPENDENT_AMBULATORY_CARE_PROVIDER_SITE_OTHER): Payer: Medicare Other | Admitting: Neurology

## 2016-11-13 DIAGNOSIS — R29898 Other symptoms and signs involving the musculoskeletal system: Secondary | ICD-10-CM

## 2016-11-13 DIAGNOSIS — M79606 Pain in leg, unspecified: Secondary | ICD-10-CM

## 2016-11-13 DIAGNOSIS — Z0289 Encounter for other administrative examinations: Secondary | ICD-10-CM

## 2016-11-13 NOTE — Progress Notes (Signed)
Full Name: Terressa KoyanagiSalvatore Roig Gender: Male MRN #: [id number] Date of Birth: 10/11/1945    Visit Date: 11/13/2016 10:59 Age: [age] Years 5 Months Old Examining Physician: Naomie DeanAntonia Ahern, MD  Referring Physician: Adrian PrinceStephen South, MD  History: 71 year old with right leg proximal weakness.   Summary: The right peroneal motor nerve showed delayed distal onset latency (8.233ms, N<6.5) and reduced amplitude (1.608mV, N>2). The right superficial peroneal sensory nerve showed reduced amplitude (3uV, N>6). The left superficial peroneal sensory nerve showed reduced amplitude (3uV, N>6).  The right Tibial F wave showed delayed latency (59ms, N<56). The left Tibial F wave showed delayed latency (58ms, N<56). The left H wave showed delayed latency (35.742ms, N<35). All remaining nerves (as detailed below) were within normal limits.  All muscles (as detailed below) were within normal limits.       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 The abnormal right Peroneal motor nerve with delayed bilateral Tibial F waves could also be due to polyneuropathy; L5-S1 radiculopathy less likely with normal EMG however can't rule it out. Recommend MRI of the lumbar spine as clinically warranted.  Cc: Dr. Vira AgarSouth  Antonia Ahern M.D.  [facility] [address] [place], [place] [address] Tel: [phone] Fax: [phone]        [facility]    Nerve / Sites Muscle Latency Ref. Amplitude Ref. Rel Amp Segments Distance Velocity Ref. Area    ms ms mV mV %  cm m/s m/s mVms  R Peroneal - EDB     Ankle EDB 8.3 ?6.5 1.8 ?2.0 100 Ankle - EDB 9   6.2     Fib head EDB 16.1  1.5  86.2 Fib head - Ankle 30 38 ?44 6.0     Pop fossa EDB 18.8  1.4  91.9 Pop fossa - Fib head 10 38 ?44 5.5         Pop fossa - Ankle      L Peroneal - EDB     Ankle EDB 5.7 ?6.5 4.3 ?2.0 100 Ankle - EDB 9   13.7     Fib head EDB 13.0   3.7  84.4 Fib head - Ankle 30 41 ?44 13.2     Pop fossa EDB 15.5  4.1  111 Pop fossa - Fib head 10 41 ?44 15.6         Pop fossa - Ankle      R Tibial - AH     Ankle AH 4.6 ?5.8 7.2 ?4.0 100 Ankle - AH 9   21.9     Pop fossa AH 14.8  6.5  90.2 Pop fossa - Ankle 35 34 ?41 20.5  L Tibial - AH     Ankle AH 4.6 ?5.8 6.7 ?4.0 100 Ankle - AH 9   20.6     Pop fossa AH 15.6  3.9  58.6 Pop fossa - Ankle 35 32 ?41 16.8             SNC    Nerve / Sites Rec. Site Peak Lat Ref.  Amp Ref. Segments Distance    ms ms V V  cm  R Sural - Ankle (Calf)     Calf Ankle 3.5 ?4.4 7 ?6 Calf - Ankle 14  L Sural - Ankle (Calf)     Calf Ankle 4.0 ?4.4 10 ?6 Calf - Ankle 14  R  Superficial peroneal - Ankle     Lat leg Ankle 4.1 ?4.4 3 ?6 Lat leg - Ankle 14  L Superficial peroneal - Ankle     Lat leg Ankle 4.3 ?4.4 3 ?6 Lat leg - Ankle 14             F  Wave    Nerve F Lat Ref.   ms ms  R Tibial - AH 59.0 ?56.0  L Tibial - AH 58.0 ?56.0         H Reflex    Nerve H Lat Lat Hmax   ms ms   Left Right Ref. Left Right Ref.  Tibial - Soleus 34.4 35.2 ?35.0 35.6 41.8 ?35.0         EMG full       EMG Summary Table    Spontaneous MUAP Recruitment  Muscle IA Fib PSW Fasc Other Amp Dur. Poly Pattern  R. Vastus medialis Normal None None None _______ Normal Normal Normal Normal  R. Tibialis anterior Normal None None None _______ Normal Normal Normal Normal  R. Gastrocnemius (Medial head) Normal None None None _______ Normal Normal Normal Normal  R. Iliopsoas Normal None None None _______ Normal Normal Normal Normal  R. Extensor hallucis longus Normal None None None _______ Normal Normal Normal Normal  R. Biceps femoris (long head) Normal None None None _______ Normal Normal Normal Normal  R. Gluteus maximus Normal None None None _______ Normal Normal Normal Normal  R. Gluteus medius Normal None None None _______ Normal Normal Normal Normal  R. Lumbar paraspinals (low) Normal None None None _______ Normal  Normal Normal Normal

## 2016-11-16 NOTE — Progress Notes (Signed)
See procedure note.

## 2016-11-26 NOTE — Procedures (Signed)
Full Name: Gavin KoyanagiSalvatore [name] Gender: Male MRN #: [id number] Date of Birth: 10/11/1945    Visit Date: 11/13/2016 10:59 Age: 71 Years 5 Months Old Examining Physician: Gavin DeanAntonia Sheilia Reznick, MD  Referring Physician: Adrian PrinceStephen South, MD  History: 71 year old with right leg proximal weakness.   Summary: The right peroneal motor nerve showed delayed distal onset latency (8.233ms, N<6.5) and reduced amplitude (1.608mV, N>2). The right superficial peroneal sensory nerve showed reduced amplitude (3uV, N>6). The left superficial peroneal sensory nerve showed reduced amplitude (3uV, N>6).  The right Tibial F wave showed delayed latency (59ms, N<56). The left Tibial F wave showed delayed latency (58ms, N<56). The left H wave showed delayed latency (35.742ms, N<35). All remaining nerves (as detailed below) were within normal limits.  All muscles (as detailed below) were within normal limits.       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 The abnormal right Peroneal motor nerve with delayed bilateral Tibial F waves could also be due to polyneuropathy; L5-S1 radiculopathy less likely with normal EMG however can't rule it out. Recommend MRI of the lumbar spine as clinically warranted.  Cc: Dr. Vira AgarSouth  Gavin [name] M.D.  [name] [address] [place], [place] [address] Tel: [phone] Fax: [phone]        [facility]    Nerve / Sites Muscle Latency Ref. Amplitude Ref. Rel Amp Segments Distance Velocity Ref. Area    ms ms mV mV %  cm m/s m/s mVms  R Peroneal - EDB     Ankle EDB 8.3 ?6.5 1.8 ?2.0 100 Ankle - EDB 9   6.2     Fib head EDB 16.1  1.5  86.2 Fib head - Ankle 30 38 ?44 6.0     Pop fossa EDB 18.8  1.4  91.9 Pop fossa - Fib head 10 38 ?44 5.5         Pop fossa - Ankle      L Peroneal - EDB     Ankle EDB 5.7 ?6.5 4.3 ?2.0 100 Ankle - EDB 9   13.7     Fib head EDB 13.0   3.7  84.4 Fib head - Ankle 30 41 ?44 13.2     Pop fossa EDB 15.5  4.1  111 Pop fossa - Fib head 10 41 ?44 15.6         Pop fossa - Ankle      R Tibial - AH     Ankle AH 4.6 ?5.8 7.2 ?4.0 100 Ankle - AH 9   21.9     Pop fossa AH 14.8  6.5  90.2 Pop fossa - Ankle 35 34 ?41 20.5  L Tibial - AH     Ankle AH 4.6 ?5.8 6.7 ?4.0 100 Ankle - AH 9   20.6     Pop fossa AH 15.6  3.9  58.6 Pop fossa - Ankle 35 32 ?41 16.8             SNC    Nerve / Sites Rec. Site Peak Lat Ref.  Amp Ref. Segments Distance    ms ms V V  cm  R Sural - Ankle (Calf)     Calf Ankle 3.5 ?4.4 7 ?6 Calf - Ankle 14  L Sural - Ankle (Calf)     Calf Ankle 4.0 ?4.4 10 ?6 Calf - Ankle 14  R  Superficial peroneal - Ankle     Lat leg Ankle 4.1 ?4.4 3 ?6 Lat leg - Ankle 14  L Superficial peroneal - Ankle     Lat leg Ankle 4.3 ?4.4 3 ?6 Lat leg - Ankle 14             F  Wave    Nerve F Lat Ref.   ms ms  R Tibial - AH 59.0 ?56.0  L Tibial - AH 58.0 ?56.0         H Reflex    Nerve H Lat Lat Hmax   ms ms   Left Right Ref. Left Right Ref.  Tibial - Soleus 34.4 35.2 ?35.0 35.6 41.8 ?35.0         EMG full       EMG Summary Table    Spontaneous MUAP Recruitment  Muscle IA Fib PSW Fasc Other Amp Dur. Poly Pattern  R. Vastus medialis Normal None None None _______ Normal Normal Normal Normal  R. Tibialis anterior Normal None None None _______ Normal Normal Normal Normal  R. Gastrocnemius (Medial head) Normal None None None _______ Normal Normal Normal Normal  R. Iliopsoas Normal None None None _______ Normal Normal Normal Normal  R. Extensor hallucis longus Normal None None None _______ Normal Normal Normal Normal  R. Biceps femoris (long head) Normal None None None _______ Normal Normal Normal Normal  R. Gluteus maximus Normal None None None _______ Normal Normal Normal Normal  R. Gluteus medius Normal None None None _______ Normal Normal Normal Normal  R. Lumbar paraspinals (low) Normal None None None _______ Normal  Normal Normal Normal      

## 2016-12-22 ENCOUNTER — Ambulatory Visit: Payer: Medicare Other | Admitting: Neurology

## 2017-03-05 DIAGNOSIS — N5201 Erectile dysfunction due to arterial insufficiency: Secondary | ICD-10-CM | POA: Diagnosis not present

## 2017-03-05 DIAGNOSIS — R310 Gross hematuria: Secondary | ICD-10-CM | POA: Diagnosis not present

## 2017-03-12 DIAGNOSIS — N200 Calculus of kidney: Secondary | ICD-10-CM | POA: Diagnosis not present

## 2017-03-12 DIAGNOSIS — R310 Gross hematuria: Secondary | ICD-10-CM | POA: Diagnosis not present

## 2017-03-17 DIAGNOSIS — R310 Gross hematuria: Secondary | ICD-10-CM | POA: Diagnosis not present

## 2017-03-17 DIAGNOSIS — N200 Calculus of kidney: Secondary | ICD-10-CM | POA: Diagnosis not present

## 2017-05-15 DIAGNOSIS — H538 Other visual disturbances: Secondary | ICD-10-CM | POA: Diagnosis not present

## 2017-05-26 DIAGNOSIS — R109 Unspecified abdominal pain: Secondary | ICD-10-CM | POA: Diagnosis not present

## 2017-05-26 DIAGNOSIS — R1084 Generalized abdominal pain: Secondary | ICD-10-CM | POA: Diagnosis not present

## 2017-05-26 DIAGNOSIS — R1111 Vomiting without nausea: Secondary | ICD-10-CM | POA: Diagnosis not present

## 2017-06-01 DIAGNOSIS — E1129 Type 2 diabetes mellitus with other diabetic kidney complication: Secondary | ICD-10-CM | POA: Diagnosis not present

## 2017-06-01 DIAGNOSIS — E7849 Other hyperlipidemia: Secondary | ICD-10-CM | POA: Diagnosis not present

## 2017-06-01 DIAGNOSIS — K219 Gastro-esophageal reflux disease without esophagitis: Secondary | ICD-10-CM | POA: Diagnosis not present

## 2017-06-01 DIAGNOSIS — E119 Type 2 diabetes mellitus without complications: Secondary | ICD-10-CM | POA: Diagnosis not present

## 2017-06-01 DIAGNOSIS — G609 Hereditary and idiopathic neuropathy, unspecified: Secondary | ICD-10-CM | POA: Diagnosis not present

## 2017-06-01 DIAGNOSIS — I10 Essential (primary) hypertension: Secondary | ICD-10-CM | POA: Diagnosis not present

## 2017-06-01 DIAGNOSIS — I739 Peripheral vascular disease, unspecified: Secondary | ICD-10-CM | POA: Diagnosis not present

## 2017-06-01 DIAGNOSIS — Z6826 Body mass index (BMI) 26.0-26.9, adult: Secondary | ICD-10-CM | POA: Diagnosis not present

## 2017-06-01 DIAGNOSIS — N08 Glomerular disorders in diseases classified elsewhere: Secondary | ICD-10-CM | POA: Diagnosis not present

## 2017-06-01 DIAGNOSIS — N401 Enlarged prostate with lower urinary tract symptoms: Secondary | ICD-10-CM | POA: Diagnosis not present

## 2017-06-09 DIAGNOSIS — R310 Gross hematuria: Secondary | ICD-10-CM | POA: Diagnosis not present

## 2017-06-09 DIAGNOSIS — R8279 Other abnormal findings on microbiological examination of urine: Secondary | ICD-10-CM | POA: Diagnosis not present

## 2017-06-24 DIAGNOSIS — J069 Acute upper respiratory infection, unspecified: Secondary | ICD-10-CM | POA: Diagnosis not present

## 2017-06-30 DIAGNOSIS — N5201 Erectile dysfunction due to arterial insufficiency: Secondary | ICD-10-CM | POA: Diagnosis not present

## 2017-06-30 DIAGNOSIS — R8279 Other abnormal findings on microbiological examination of urine: Secondary | ICD-10-CM | POA: Diagnosis not present

## 2017-06-30 DIAGNOSIS — R310 Gross hematuria: Secondary | ICD-10-CM | POA: Diagnosis not present

## 2017-07-20 DIAGNOSIS — Z794 Long term (current) use of insulin: Secondary | ICD-10-CM | POA: Diagnosis not present

## 2017-07-20 DIAGNOSIS — H2513 Age-related nuclear cataract, bilateral: Secondary | ICD-10-CM | POA: Diagnosis not present

## 2017-07-20 DIAGNOSIS — E119 Type 2 diabetes mellitus without complications: Secondary | ICD-10-CM | POA: Diagnosis not present

## 2017-07-20 DIAGNOSIS — H25013 Cortical age-related cataract, bilateral: Secondary | ICD-10-CM | POA: Diagnosis not present

## 2017-08-05 DIAGNOSIS — H2511 Age-related nuclear cataract, right eye: Secondary | ICD-10-CM | POA: Diagnosis not present

## 2017-08-05 DIAGNOSIS — H25011 Cortical age-related cataract, right eye: Secondary | ICD-10-CM | POA: Diagnosis not present

## 2017-08-05 DIAGNOSIS — H25012 Cortical age-related cataract, left eye: Secondary | ICD-10-CM | POA: Diagnosis not present

## 2017-08-05 DIAGNOSIS — H2512 Age-related nuclear cataract, left eye: Secondary | ICD-10-CM | POA: Diagnosis not present

## 2017-08-26 DIAGNOSIS — H2511 Age-related nuclear cataract, right eye: Secondary | ICD-10-CM | POA: Diagnosis not present

## 2017-08-26 DIAGNOSIS — H25011 Cortical age-related cataract, right eye: Secondary | ICD-10-CM | POA: Diagnosis not present

## 2017-09-28 DIAGNOSIS — N08 Glomerular disorders in diseases classified elsewhere: Secondary | ICD-10-CM | POA: Diagnosis not present

## 2017-09-28 DIAGNOSIS — E1129 Type 2 diabetes mellitus with other diabetic kidney complication: Secondary | ICD-10-CM | POA: Diagnosis not present

## 2017-09-28 DIAGNOSIS — N401 Enlarged prostate with lower urinary tract symptoms: Secondary | ICD-10-CM | POA: Diagnosis not present

## 2017-09-28 DIAGNOSIS — E7849 Other hyperlipidemia: Secondary | ICD-10-CM | POA: Diagnosis not present

## 2017-09-28 DIAGNOSIS — I10 Essential (primary) hypertension: Secondary | ICD-10-CM | POA: Diagnosis not present

## 2017-09-28 DIAGNOSIS — R109 Unspecified abdominal pain: Secondary | ICD-10-CM | POA: Diagnosis not present

## 2017-09-28 DIAGNOSIS — Z8673 Personal history of transient ischemic attack (TIA), and cerebral infarction without residual deficits: Secondary | ICD-10-CM | POA: Diagnosis not present

## 2017-09-28 DIAGNOSIS — Z6826 Body mass index (BMI) 26.0-26.9, adult: Secondary | ICD-10-CM | POA: Diagnosis not present

## 2017-10-05 DIAGNOSIS — Z961 Presence of intraocular lens: Secondary | ICD-10-CM | POA: Diagnosis not present

## 2017-11-18 DIAGNOSIS — R310 Gross hematuria: Secondary | ICD-10-CM | POA: Diagnosis not present

## 2017-11-18 DIAGNOSIS — N200 Calculus of kidney: Secondary | ICD-10-CM | POA: Diagnosis not present

## 2017-11-18 DIAGNOSIS — N5201 Erectile dysfunction due to arterial insufficiency: Secondary | ICD-10-CM | POA: Diagnosis not present

## 2017-12-14 DIAGNOSIS — E1165 Type 2 diabetes mellitus with hyperglycemia: Secondary | ICD-10-CM | POA: Diagnosis not present

## 2017-12-14 DIAGNOSIS — K219 Gastro-esophageal reflux disease without esophagitis: Secondary | ICD-10-CM | POA: Diagnosis not present

## 2017-12-14 DIAGNOSIS — Z23 Encounter for immunization: Secondary | ICD-10-CM | POA: Diagnosis not present

## 2017-12-14 DIAGNOSIS — I10 Essential (primary) hypertension: Secondary | ICD-10-CM | POA: Diagnosis not present

## 2017-12-14 DIAGNOSIS — N400 Enlarged prostate without lower urinary tract symptoms: Secondary | ICD-10-CM | POA: Diagnosis not present

## 2017-12-14 DIAGNOSIS — Z79899 Other long term (current) drug therapy: Secondary | ICD-10-CM | POA: Diagnosis not present

## 2017-12-14 DIAGNOSIS — E7800 Pure hypercholesterolemia, unspecified: Secondary | ICD-10-CM | POA: Diagnosis not present

## 2017-12-14 DIAGNOSIS — Z0001 Encounter for general adult medical examination with abnormal findings: Secondary | ICD-10-CM | POA: Diagnosis not present

## 2017-12-14 DIAGNOSIS — I700 Atherosclerosis of aorta: Secondary | ICD-10-CM | POA: Diagnosis not present

## 2018-01-11 DIAGNOSIS — Z961 Presence of intraocular lens: Secondary | ICD-10-CM | POA: Diagnosis not present

## 2018-01-11 DIAGNOSIS — H04123 Dry eye syndrome of bilateral lacrimal glands: Secondary | ICD-10-CM | POA: Diagnosis not present

## 2018-01-25 DIAGNOSIS — E1165 Type 2 diabetes mellitus with hyperglycemia: Secondary | ICD-10-CM | POA: Diagnosis not present

## 2018-01-25 DIAGNOSIS — N08 Glomerular disorders in diseases classified elsewhere: Secondary | ICD-10-CM | POA: Diagnosis not present

## 2018-01-25 DIAGNOSIS — N401 Enlarged prostate with lower urinary tract symptoms: Secondary | ICD-10-CM | POA: Diagnosis not present

## 2018-01-25 DIAGNOSIS — K219 Gastro-esophageal reflux disease without esophagitis: Secondary | ICD-10-CM | POA: Diagnosis not present

## 2018-01-25 DIAGNOSIS — I10 Essential (primary) hypertension: Secondary | ICD-10-CM | POA: Diagnosis not present

## 2018-01-25 DIAGNOSIS — E1129 Type 2 diabetes mellitus with other diabetic kidney complication: Secondary | ICD-10-CM | POA: Diagnosis not present

## 2018-01-25 DIAGNOSIS — E7849 Other hyperlipidemia: Secondary | ICD-10-CM | POA: Diagnosis not present

## 2018-04-05 DIAGNOSIS — H04123 Dry eye syndrome of bilateral lacrimal glands: Secondary | ICD-10-CM | POA: Diagnosis not present

## 2018-04-05 DIAGNOSIS — Z961 Presence of intraocular lens: Secondary | ICD-10-CM | POA: Diagnosis not present

## 2018-04-06 DIAGNOSIS — M25561 Pain in right knee: Secondary | ICD-10-CM | POA: Diagnosis not present

## 2018-04-06 DIAGNOSIS — M25562 Pain in left knee: Secondary | ICD-10-CM | POA: Diagnosis not present

## 2018-04-06 DIAGNOSIS — M170 Bilateral primary osteoarthritis of knee: Secondary | ICD-10-CM | POA: Diagnosis not present

## 2018-04-07 DIAGNOSIS — E119 Type 2 diabetes mellitus without complications: Secondary | ICD-10-CM | POA: Diagnosis not present

## 2018-04-26 DIAGNOSIS — H04123 Dry eye syndrome of bilateral lacrimal glands: Secondary | ICD-10-CM | POA: Diagnosis not present

## 2018-04-26 DIAGNOSIS — Z961 Presence of intraocular lens: Secondary | ICD-10-CM | POA: Diagnosis not present

## 2018-05-03 ENCOUNTER — Other Ambulatory Visit: Payer: Self-pay

## 2018-05-03 NOTE — Patient Outreach (Signed)
  [facility]) Care Management Chronic Special Needs Program  05/03/2018  Name: Gavin [name] DOB: 02/06/1946  MRN: [id number]  [name] is enrolled in a chronic special needs plan for  Diabetes. A completed health risk assessment has not been received from the client and client has not responded to outreach attempts by their health care concierge.  The client's individualized care plan was developed based on available data.  Plan:  . Send unsuccessful outreach letter with a copy of individualized care plan to client . Send Therapist, occupational . Send individualized care plan to provider  Chronic care management coordinator will attempt outreach in 2-4 months   Dudley Major RN, [facility], CDE Chronic Care Management Coordinator [facility] Care Management [phone]

## 2018-06-03 DIAGNOSIS — E1165 Type 2 diabetes mellitus with hyperglycemia: Secondary | ICD-10-CM | POA: Diagnosis not present

## 2018-06-03 DIAGNOSIS — E7849 Other hyperlipidemia: Secondary | ICD-10-CM | POA: Diagnosis not present

## 2018-06-03 DIAGNOSIS — I5189 Other ill-defined heart diseases: Secondary | ICD-10-CM | POA: Diagnosis not present

## 2018-06-03 DIAGNOSIS — Z794 Long term (current) use of insulin: Secondary | ICD-10-CM | POA: Diagnosis not present

## 2018-06-03 DIAGNOSIS — Z8673 Personal history of transient ischemic attack (TIA), and cerebral infarction without residual deficits: Secondary | ICD-10-CM | POA: Diagnosis not present

## 2018-06-03 DIAGNOSIS — F4310 Post-traumatic stress disorder, unspecified: Secondary | ICD-10-CM | POA: Diagnosis not present

## 2018-06-03 DIAGNOSIS — E1129 Type 2 diabetes mellitus with other diabetic kidney complication: Secondary | ICD-10-CM | POA: Diagnosis not present

## 2018-06-03 DIAGNOSIS — G4700 Insomnia, unspecified: Secondary | ICD-10-CM | POA: Diagnosis not present

## 2018-06-03 DIAGNOSIS — F315 Bipolar disorder, current episode depressed, severe, with psychotic features: Secondary | ICD-10-CM | POA: Diagnosis not present

## 2018-06-03 DIAGNOSIS — N401 Enlarged prostate with lower urinary tract symptoms: Secondary | ICD-10-CM | POA: Diagnosis not present

## 2018-06-03 DIAGNOSIS — N08 Glomerular disorders in diseases classified elsewhere: Secondary | ICD-10-CM | POA: Diagnosis not present

## 2018-06-03 DIAGNOSIS — I10 Essential (primary) hypertension: Secondary | ICD-10-CM | POA: Diagnosis not present

## 2018-06-03 DIAGNOSIS — F411 Generalized anxiety disorder: Secondary | ICD-10-CM | POA: Diagnosis not present

## 2018-06-03 LAB — HEMOGLOBIN A1C: Hemoglobin A1C: 7.6

## 2018-06-16 DIAGNOSIS — E7849 Other hyperlipidemia: Secondary | ICD-10-CM | POA: Diagnosis not present

## 2018-06-16 DIAGNOSIS — N08 Glomerular disorders in diseases classified elsewhere: Secondary | ICD-10-CM | POA: Diagnosis not present

## 2018-08-03 ENCOUNTER — Other Ambulatory Visit: Payer: Self-pay

## 2018-08-03 NOTE — Patient Outreach (Signed)
  [facility]) Care Management Chronic Special Needs Program  08/03/2018  Name: Gavin [name] DOB: June 16, 1945  MRN: [id number]  [name] is enrolled in a chronic special needs plan for Diabetes. Chronic Care Management Coordinator telephoned client to review health risk assessment and to develop individualized care plan.  Introduced the chronic care management program, importance of client participation, and taking their care plan to all provider appointments and inpatient facilities.  Reviewed the transition of care process and possible referral to community care management.  Subjective: Client states that he has been trying to watch his portion sizes with pasta and he tries to not eat often.  States he likes to eat fish and vegetables.  States his blood sugars range from 85-150. States he tries to walk 5 miles most days. States he has been planning to see a lawyer about wills when the Covid is better.  States his B/P is good with his medication.   Goals Addressed            This Visit's Progress   .  Acknowledge receipt of Advanced Directive package   On track   . Advanced Care Planning complete by next 9 months   On track   . Client understands the importance of follow-up with providers by attending scheduled visits   On track   . Client will use Assistive Devices as needed and verbalize understanding of device use   On track   . Client will verbalize knowledge of self management of Hypertension as evidences by BP reading of 140/90 or less; or as defined by provider   On track   . HEMOGLOBIN A1C < 7.0       Diabetes self management actions:  Glucose monitoring per provider recommendations  Eat Healthy  Check feet daily  Visit provider every 3-6 months as directed  Hbg A1C level every 3-6 months.  Eye Exam yearly    . Maintain timely refills of diabetic medication as prescribed within the year .   On track   . COMPLETED: Obtain annual  Lipid Profile,  LDL-C       Completed 06/16/18    . Obtain Annual Eye (retinal)  Exam    On track   . Obtain Annual Foot Exam   On track   . Obtain annual screen for micro albuminuria (urine) , nephropathy (kidney problems)   On track   . Obtain Hemoglobin A1C at least 2 times per year   On track   . Visit Primary Care Provider or Endocrinologist at least 2 times per year    On track    Client is not meeting diabetes self management goal of hemoglobin A1C of <7% with last reading of 7.6%.  Client declines social work referral for [employer] [facility] but would like forms resent to him. Encouraged to continue to walk daily and reviewed benefits of exercise to glycemic control Reviewed low CHO diet and portion sizes  Plan:  Send successful outreach letter with a copy of their individualized care plan, Send individual care plan to provider and Send educational material-Advanced Directives packet  Chronic care management coordination will outreach in: 5-6 Months     Dudley Major RN, [facility], CDE Chronic Care Management Coordinator [facility] Care Management [phone]

## 2018-08-13 DIAGNOSIS — R310 Gross hematuria: Secondary | ICD-10-CM | POA: Diagnosis not present

## 2018-08-16 DIAGNOSIS — H26493 Other secondary cataract, bilateral: Secondary | ICD-10-CM | POA: Diagnosis not present

## 2018-08-16 DIAGNOSIS — Z961 Presence of intraocular lens: Secondary | ICD-10-CM | POA: Diagnosis not present

## 2018-08-16 DIAGNOSIS — H26491 Other secondary cataract, right eye: Secondary | ICD-10-CM | POA: Diagnosis not present

## 2018-08-16 DIAGNOSIS — H04123 Dry eye syndrome of bilateral lacrimal glands: Secondary | ICD-10-CM | POA: Diagnosis not present

## 2018-08-23 DIAGNOSIS — H40013 Open angle with borderline findings, low risk, bilateral: Secondary | ICD-10-CM | POA: Diagnosis not present

## 2018-09-17 IMAGING — RF DG UGI W/ HIGH DENSITY W/KUB
6 series · 14 of 23 positions shown · non-contrast
Comparison: CT abdomen pelvis of 01/07/2016

CLINICAL DATA: Dysphagia

EXAM:
UPPER GI SERIES WITH KUB
TECHNIQUE: After obtaining a scout radiograph a routine upper GI series was
performed using thin and high density barium.
FLUOROSCOPY TIME:  Fluoroscopy Time:  1 minutes 36 seconds
Radiation Exposure Index (if provided by the fluoroscopic device):
225 mGy
Number of Acquired Spot Images: 0

[Series 1: one shot · 1 of 1 slices shown (1 of 3)]
[im 1/1]
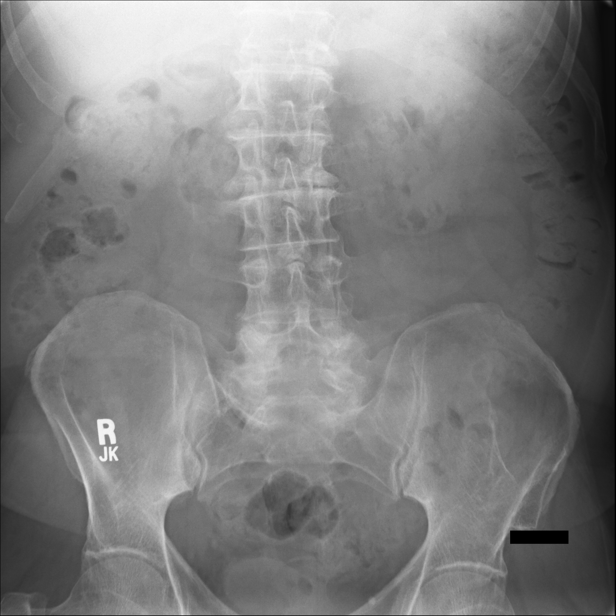

[Series 2: sequence · 2 of 21 frames shown (1 of 3)]
[frame 11/21]
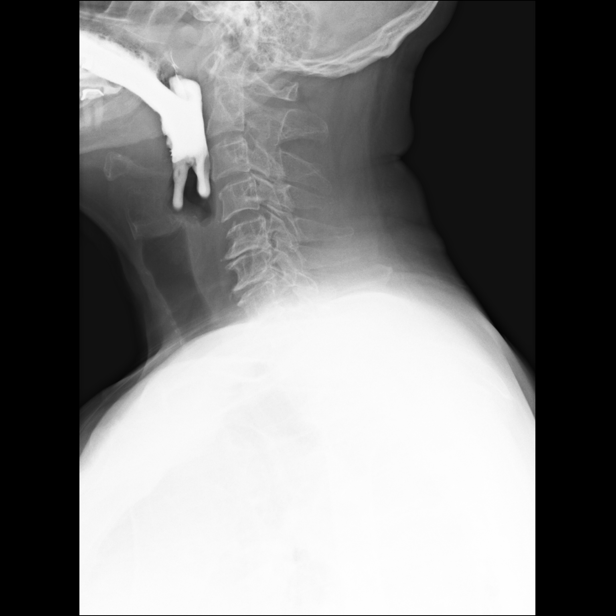
[frame 21/21]
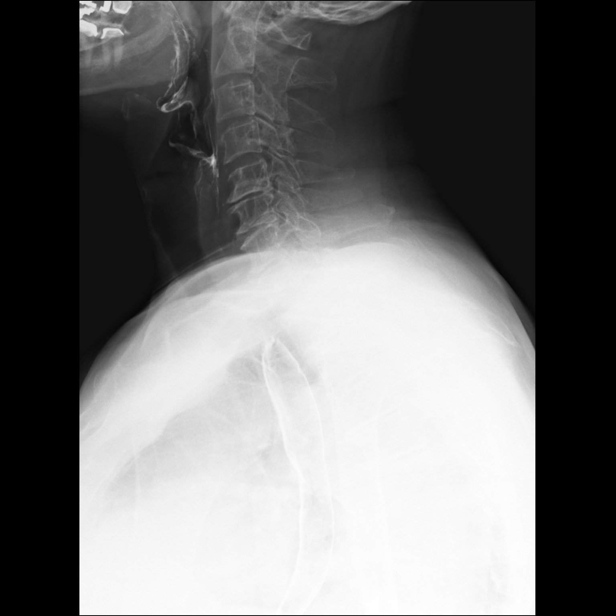

[Series 3: sequence · 2 of 8 frames shown (2 of 3)]
[frame 2/8]
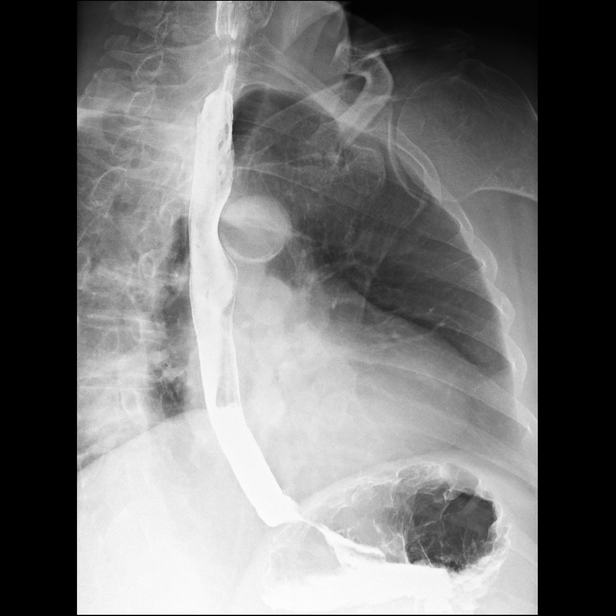
[frame 6/8]
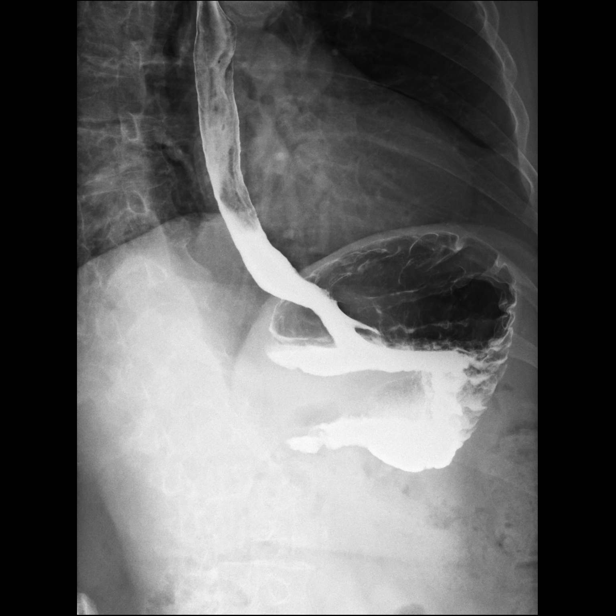

[Series 4: one shot · 3 of 4 slices shown (2 of 3)]
[im 1/4]
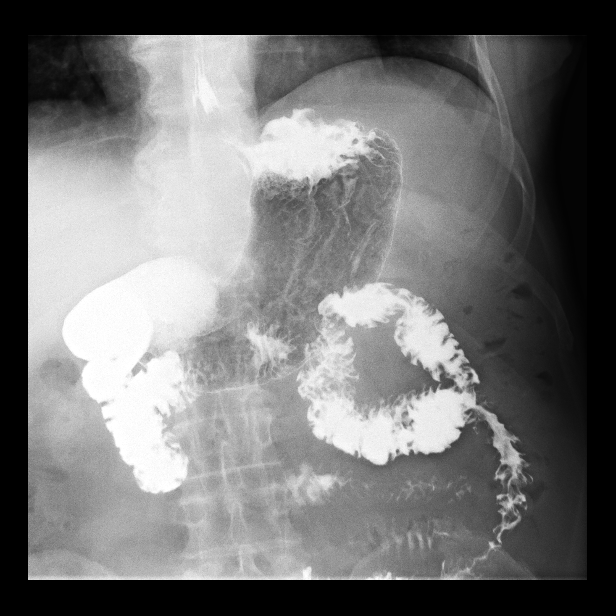
[im 2/4]
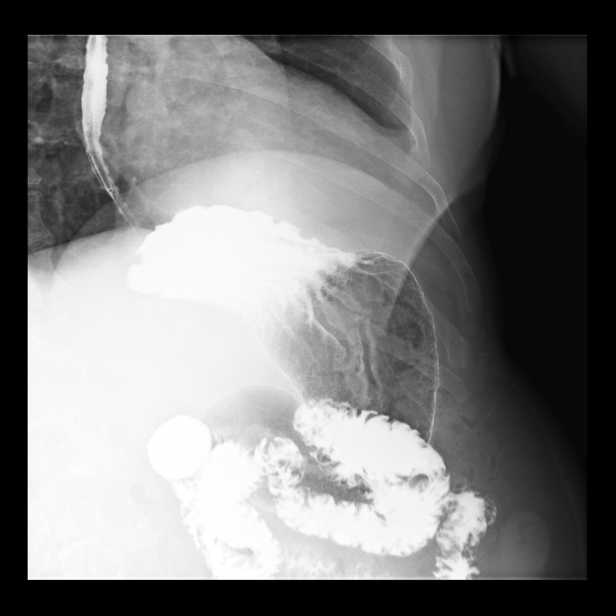
[im 4/4]
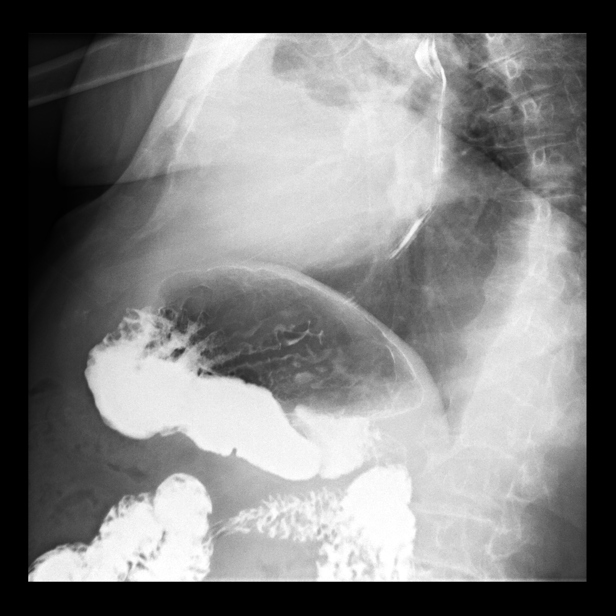

[Series 5: sequence · 2 of 93 frames shown (3 of 3)]
[frame 14/93]
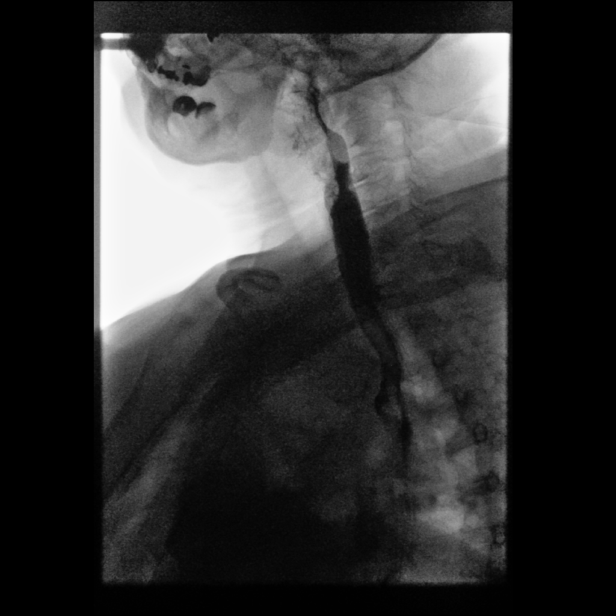
[frame 47/93]
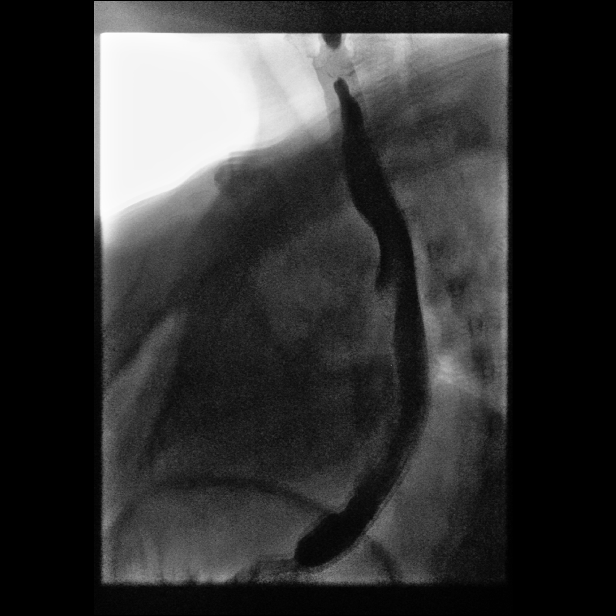

[Series 6: one shot · 4 of 6 slices shown (3 of 3)]
[im 1/6]
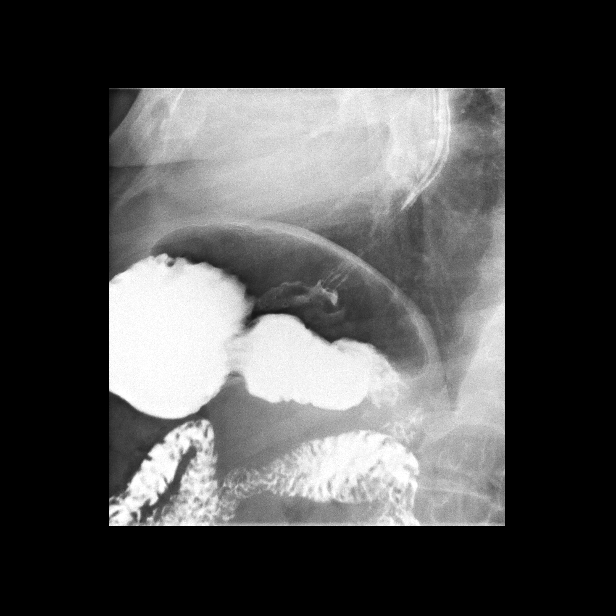
[im 2/6]
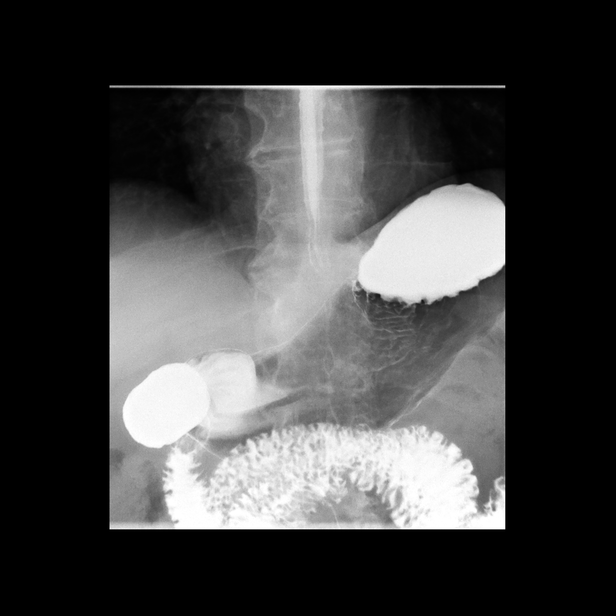
[im 4/6]
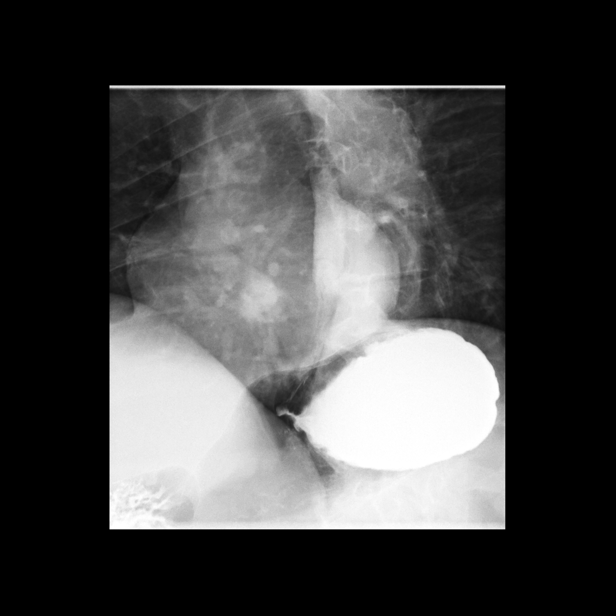
[im 6/6]
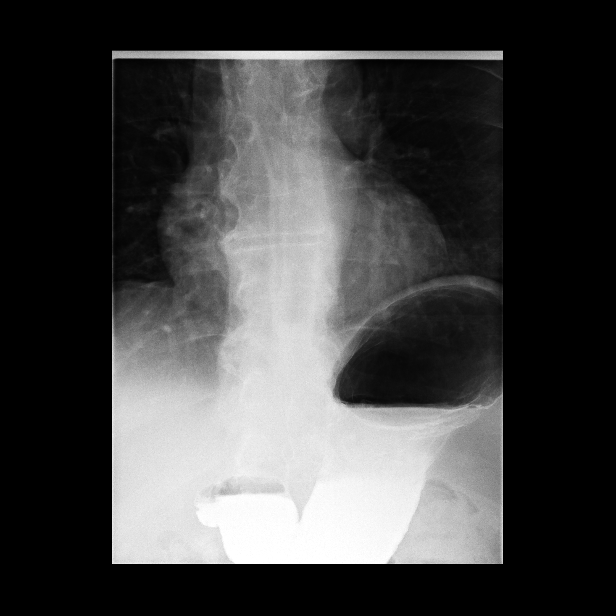

[14 of 23 positions shown; findings below may reference images not displayed]

FINDINGS: A preliminary film of the abdomen shows no bowel obstruction. No
opaque calculi are seen. There are degenerative changes in the lower
lumbar spine.

Initially rapid sequence spot films of the cervical esophagus were
obtained of the lateral view in this patient who complains of
coughing intermittently after swallowing. No penetration or
aspiration is seen. There is some prominence of the cricopharyngeus
muscle noted. A double contrast study shows the mucosa of the
esophagus to be unremarkable. Esophageal peristalsis is normal. No
hiatal hernia is seen. There is moderate gastroesophageal reflux
demonstrated. A barium pill was given at the end of the study which
passed into the stomach without delay

The stomach is normal in contour and peristalsis. The duodenal bulb
fills and the duodenal loop is in normal position.
IMPRESSION: 1. Moderate gastroesophageal reflux. No hiatal hernia. Barium pill
passes into the stomach without delay.
2. No penetration or aspiration.
3. No abnormality of the stomach or duodenum

## 2018-10-05 DIAGNOSIS — N401 Enlarged prostate with lower urinary tract symptoms: Secondary | ICD-10-CM | POA: Diagnosis not present

## 2018-10-05 DIAGNOSIS — E1129 Type 2 diabetes mellitus with other diabetic kidney complication: Secondary | ICD-10-CM | POA: Diagnosis not present

## 2018-10-05 DIAGNOSIS — Z794 Long term (current) use of insulin: Secondary | ICD-10-CM | POA: Diagnosis not present

## 2018-10-05 DIAGNOSIS — E785 Hyperlipidemia, unspecified: Secondary | ICD-10-CM | POA: Diagnosis not present

## 2018-10-05 DIAGNOSIS — N08 Glomerular disorders in diseases classified elsewhere: Secondary | ICD-10-CM | POA: Diagnosis not present

## 2018-10-05 DIAGNOSIS — Z8673 Personal history of transient ischemic attack (TIA), and cerebral infarction without residual deficits: Secondary | ICD-10-CM | POA: Diagnosis not present

## 2018-10-05 DIAGNOSIS — I10 Essential (primary) hypertension: Secondary | ICD-10-CM | POA: Diagnosis not present

## 2018-10-05 DIAGNOSIS — I5189 Other ill-defined heart diseases: Secondary | ICD-10-CM | POA: Diagnosis not present

## 2018-10-05 DIAGNOSIS — K219 Gastro-esophageal reflux disease without esophagitis: Secondary | ICD-10-CM | POA: Diagnosis not present

## 2018-10-05 DIAGNOSIS — I739 Peripheral vascular disease, unspecified: Secondary | ICD-10-CM | POA: Diagnosis not present

## 2018-11-16 DIAGNOSIS — R0781 Pleurodynia: Secondary | ICD-10-CM | POA: Diagnosis not present

## 2018-11-16 DIAGNOSIS — R1011 Right upper quadrant pain: Secondary | ICD-10-CM | POA: Diagnosis not present

## 2018-11-19 ENCOUNTER — Other Ambulatory Visit: Payer: Self-pay | Admitting: Family Medicine

## 2018-11-19 DIAGNOSIS — R1011 Right upper quadrant pain: Secondary | ICD-10-CM

## 2018-11-25 ENCOUNTER — Other Ambulatory Visit: Payer: Self-pay | Admitting: Family Medicine

## 2018-11-25 DIAGNOSIS — R1011 Right upper quadrant pain: Secondary | ICD-10-CM

## 2018-12-29 DIAGNOSIS — N342 Other urethritis: Secondary | ICD-10-CM | POA: Diagnosis not present

## 2018-12-30 ENCOUNTER — Other Ambulatory Visit: Payer: HMO

## 2018-12-30 ENCOUNTER — Ambulatory Visit
Admission: RE | Admit: 2018-12-30 | Discharge: 2018-12-30 | Disposition: A | Discharge status: Home / Self Care | Payer: HMO | Source: Ambulatory Visit | Attending: Family Medicine | Admitting: Family Medicine

## 2018-12-30 DIAGNOSIS — K8020 Calculus of gallbladder without cholecystitis without obstruction: Secondary | ICD-10-CM | POA: Diagnosis not present

## 2018-12-30 DIAGNOSIS — E785 Hyperlipidemia, unspecified: Secondary | ICD-10-CM | POA: Diagnosis not present

## 2018-12-30 DIAGNOSIS — I10 Essential (primary) hypertension: Secondary | ICD-10-CM | POA: Diagnosis not present

## 2018-12-30 DIAGNOSIS — K219 Gastro-esophageal reflux disease without esophagitis: Secondary | ICD-10-CM | POA: Diagnosis not present

## 2018-12-30 DIAGNOSIS — R1011 Right upper quadrant pain: Secondary | ICD-10-CM

## 2018-12-30 DIAGNOSIS — N08 Glomerular disorders in diseases classified elsewhere: Secondary | ICD-10-CM | POA: Diagnosis not present

## 2018-12-30 DIAGNOSIS — N401 Enlarged prostate with lower urinary tract symptoms: Secondary | ICD-10-CM | POA: Diagnosis not present

## 2018-12-30 DIAGNOSIS — I739 Peripheral vascular disease, unspecified: Secondary | ICD-10-CM | POA: Diagnosis not present

## 2018-12-30 DIAGNOSIS — E1129 Type 2 diabetes mellitus with other diabetic kidney complication: Secondary | ICD-10-CM | POA: Diagnosis not present

## 2018-12-30 DIAGNOSIS — Z8673 Personal history of transient ischemic attack (TIA), and cerebral infarction without residual deficits: Secondary | ICD-10-CM | POA: Diagnosis not present

## 2018-12-30 DIAGNOSIS — I5189 Other ill-defined heart diseases: Secondary | ICD-10-CM | POA: Diagnosis not present

## 2018-12-30 DIAGNOSIS — Z794 Long term (current) use of insulin: Secondary | ICD-10-CM | POA: Diagnosis not present

## 2019-01-03 DIAGNOSIS — K219 Gastro-esophageal reflux disease without esophagitis: Secondary | ICD-10-CM | POA: Diagnosis not present

## 2019-01-03 DIAGNOSIS — I700 Atherosclerosis of aorta: Secondary | ICD-10-CM | POA: Diagnosis not present

## 2019-01-03 DIAGNOSIS — Z125 Encounter for screening for malignant neoplasm of prostate: Secondary | ICD-10-CM | POA: Diagnosis not present

## 2019-01-03 DIAGNOSIS — E7800 Pure hypercholesterolemia, unspecified: Secondary | ICD-10-CM | POA: Diagnosis not present

## 2019-01-03 DIAGNOSIS — E1169 Type 2 diabetes mellitus with other specified complication: Secondary | ICD-10-CM | POA: Diagnosis not present

## 2019-01-03 DIAGNOSIS — Z7984 Long term (current) use of oral hypoglycemic drugs: Secondary | ICD-10-CM | POA: Diagnosis not present

## 2019-01-03 DIAGNOSIS — Z79899 Other long term (current) drug therapy: Secondary | ICD-10-CM | POA: Diagnosis not present

## 2019-01-03 DIAGNOSIS — K8020 Calculus of gallbladder without cholecystitis without obstruction: Secondary | ICD-10-CM | POA: Diagnosis not present

## 2019-01-03 DIAGNOSIS — I10 Essential (primary) hypertension: Secondary | ICD-10-CM | POA: Diagnosis not present

## 2019-01-03 DIAGNOSIS — Z0001 Encounter for general adult medical examination with abnormal findings: Secondary | ICD-10-CM | POA: Diagnosis not present

## 2019-01-06 DIAGNOSIS — Z03818 Encounter for observation for suspected exposure to other biological agents ruled out: Secondary | ICD-10-CM | POA: Diagnosis not present

## 2019-01-11 DIAGNOSIS — E119 Type 2 diabetes mellitus without complications: Secondary | ICD-10-CM | POA: Diagnosis not present

## 2019-01-11 DIAGNOSIS — U071 COVID-19: Secondary | ICD-10-CM | POA: Diagnosis not present

## 2019-01-11 DIAGNOSIS — R0789 Other chest pain: Secondary | ICD-10-CM | POA: Diagnosis not present

## 2019-01-17 DIAGNOSIS — U071 COVID-19: Secondary | ICD-10-CM | POA: Diagnosis not present

## 2019-01-17 DIAGNOSIS — J45909 Unspecified asthma, uncomplicated: Secondary | ICD-10-CM | POA: Diagnosis not present

## 2019-01-17 DIAGNOSIS — E119 Type 2 diabetes mellitus without complications: Secondary | ICD-10-CM | POA: Diagnosis not present

## 2019-01-20 ENCOUNTER — Other Ambulatory Visit: Payer: Self-pay

## 2019-01-20 NOTE — Patient Outreach (Signed)
  [facility]) Care Management Chronic Special Needs Program  01/20/2019  Name: Gavin [name] DOB: [birth year]  MRN: [id number]  Mr. Annie Roseboom is enrolled in a chronic special needs plan for Diabetes. Client called with no answer No answer and HIPAA compliant message left. 1st attempt Plan for 2nd outreach call in 2-3 weeks Chronic care management coordinator will attempt outreach in 2-3 weeks .   Peter Garter RN, [facility], CDE Chronic Care Management Coordinator [facility] Management [phone]

## 2019-01-26 DIAGNOSIS — Z20828 Contact with and (suspected) exposure to other viral communicable diseases: Secondary | ICD-10-CM | POA: Diagnosis not present

## 2019-02-03 ENCOUNTER — Other Ambulatory Visit: Payer: Self-pay

## 2019-02-03 NOTE — Patient Outreach (Signed)
  [facility]) Care Management Chronic Special Needs Program  02/03/2019  Name: Gavin [name] DOB: Apr 18, 1945  MRN: [id number]  [name] is enrolled in a chronic special needs plan for Diabetes.  Client called to review and updated care plan.  Subjective:Client states that he has moved to [place] and he now has new insurance starting on 02/01/19.  Goals Addressed            This Visit's Progress   . COMPLETED:  Acknowledge receipt of Advanced Directive package       Client dis-enrolled from Chronic Special Needs Plan.  Case manager will not be following goal    . COMPLETED: Advanced Care Planning complete by next 9 months       Client dis-enrolled from Chronic Special Needs Plan.  Case manager will not be following goal    . COMPLETED: Client understands the importance of follow-up with providers by attending scheduled visits       Client dis-enrolled from Chronic Special Needs Plan.  Case manager will not be following goal    . COMPLETED: Client will use Assistive Devices as needed and verbalize understanding of device use       Client dis-enrolled from Chronic Special Needs Plan.  Case manager will not be following goal    . COMPLETED: Client will verbalize knowledge of self management of Hypertension as evidences by BP reading of 140/90 or less; or as defined by provider       Client dis-enrolled from Chronic Special Needs Plan.  Case manager will not be following goal    . HEMOGLOBIN A1C < 7.0       Client dis-enrolled from Chronic Special Needs Plan.  Case manager will not be following goal    . COMPLETED: Maintain timely refills of diabetic medication as prescribed within the year .       Client dis-enrolled from Chronic Special Needs Plan.  Case manager will not be following goal    . COMPLETED: Obtain Annual Eye (retinal)  Exam        Completed 04/26/18    . COMPLETED: Obtain Annual Foot Exam       Completed 12/30/18    . COMPLETED:  Obtain annual screen for micro albuminuria (urine) , nephropathy (kidney problems)       Completed 06/02/18    . COMPLETED: Obtain Hemoglobin A1C at least 2 times per year       Completed 06/03/18, 12/30/18    . COMPLETED: Visit Primary Care Provider or Endocrinologist at least 2 times per year        Completed 06/16/18,10/05/18,12/30/18     Called and verified that client is disenrolled from [employer] Chronic Special Needs Program   Plan:  Case closed as client has disenrolled from the plan Plan to send case closure letter to client and MD     Peter Garter RN, [facility], [place] Management Coordinator [facility] [phone]

## 2022-01-25 ENCOUNTER — Inpatient Hospital Stay (HOSPITAL_COMMUNITY): Payer: Medicare (Managed Care)

## 2022-01-25 ENCOUNTER — Emergency Department (HOSPITAL_COMMUNITY): Payer: Medicare (Managed Care)

## 2022-01-25 ENCOUNTER — Inpatient Hospital Stay (HOSPITAL_COMMUNITY)
Admission: EM | Admit: 2022-01-25 | Discharge: 2022-01-27 | DRG: 062 | Disposition: A | Discharge status: Home / Self Care | Payer: Medicare (Managed Care) | Attending: Neurology | Admitting: Neurology

## 2022-01-25 ENCOUNTER — Encounter (HOSPITAL_COMMUNITY): Payer: Self-pay

## 2022-01-25 ENCOUNTER — Other Ambulatory Visit: Payer: Self-pay

## 2022-01-25 DIAGNOSIS — Z888 Allergy status to other drugs, medicaments and biological substances status: Secondary | ICD-10-CM

## 2022-01-25 DIAGNOSIS — E119 Type 2 diabetes mellitus without complications: Secondary | ICD-10-CM

## 2022-01-25 DIAGNOSIS — E785 Hyperlipidemia, unspecified: Secondary | ICD-10-CM | POA: Diagnosis present

## 2022-01-25 DIAGNOSIS — N400 Enlarged prostate without lower urinary tract symptoms: Secondary | ICD-10-CM | POA: Diagnosis present

## 2022-01-25 DIAGNOSIS — Z7984 Long term (current) use of oral hypoglycemic drugs: Secondary | ICD-10-CM

## 2022-01-25 DIAGNOSIS — Z8673 Personal history of transient ischemic attack (TIA), and cerebral infarction without residual deficits: Secondary | ICD-10-CM | POA: Diagnosis present

## 2022-01-25 DIAGNOSIS — I6330 Cerebral infarction due to thrombosis of unspecified cerebral artery: Secondary | ICD-10-CM | POA: Diagnosis not present

## 2022-01-25 DIAGNOSIS — R29703 NIHSS score 3: Secondary | ICD-10-CM | POA: Diagnosis present

## 2022-01-25 DIAGNOSIS — I6523 Occlusion and stenosis of bilateral carotid arteries: Secondary | ICD-10-CM | POA: Diagnosis present

## 2022-01-25 DIAGNOSIS — I6389 Other cerebral infarction: Secondary | ICD-10-CM | POA: Diagnosis not present

## 2022-01-25 DIAGNOSIS — I10 Essential (primary) hypertension: Secondary | ICD-10-CM | POA: Diagnosis present

## 2022-01-25 DIAGNOSIS — Z7982 Long term (current) use of aspirin: Secondary | ICD-10-CM

## 2022-01-25 DIAGNOSIS — E1165 Type 2 diabetes mellitus with hyperglycemia: Secondary | ICD-10-CM | POA: Diagnosis present

## 2022-01-25 DIAGNOSIS — K219 Gastro-esophageal reflux disease without esophagitis: Secondary | ICD-10-CM | POA: Diagnosis present

## 2022-01-25 DIAGNOSIS — I6381 Other cerebral infarction due to occlusion or stenosis of small artery: Secondary | ICD-10-CM | POA: Diagnosis present

## 2022-01-25 DIAGNOSIS — E7800 Pure hypercholesterolemia, unspecified: Secondary | ICD-10-CM | POA: Diagnosis present

## 2022-01-25 DIAGNOSIS — Z794 Long term (current) use of insulin: Secondary | ICD-10-CM

## 2022-01-25 DIAGNOSIS — Z87891 Personal history of nicotine dependence: Secondary | ICD-10-CM

## 2022-01-25 DIAGNOSIS — Z79890 Hormone replacement therapy (postmenopausal): Secondary | ICD-10-CM | POA: Diagnosis not present

## 2022-01-25 DIAGNOSIS — I639 Cerebral infarction, unspecified: Secondary | ICD-10-CM | POA: Diagnosis present

## 2022-01-25 DIAGNOSIS — R200 Anesthesia of skin: Secondary | ICD-10-CM | POA: Diagnosis present

## 2022-01-25 DIAGNOSIS — G8194 Hemiplegia, unspecified affecting left nondominant side: Secondary | ICD-10-CM | POA: Diagnosis present

## 2022-01-25 DIAGNOSIS — Z833 Family history of diabetes mellitus: Secondary | ICD-10-CM

## 2022-01-25 DIAGNOSIS — I63331 Cerebral infarction due to thrombosis of right posterior cerebral artery: Secondary | ICD-10-CM | POA: Diagnosis not present

## 2022-01-25 DIAGNOSIS — Z7985 Long-term (current) use of injectable non-insulin antidiabetic drugs: Secondary | ICD-10-CM

## 2022-01-25 LAB — DIFFERENTIAL
Abs Immature Granulocytes: 0.04 10*3/uL (ref 0.00–0.07)
Basophils Absolute: 0.1 10*3/uL (ref 0.0–0.1)
Basophils Relative: 1 %
Eosinophils Absolute: 0.5 10*3/uL (ref 0.0–0.5)
Eosinophils Relative: 5 %
Immature Granulocytes: 0 %
Lymphocytes Relative: 24 %
Lymphs Abs: 2.4 10*3/uL (ref 0.7–4.0)
Monocytes Absolute: 0.8 10*3/uL (ref 0.1–1.0)
Monocytes Relative: 8 %
Neutro Abs: 6 10*3/uL (ref 1.7–7.7)
Neutrophils Relative %: 62 %

## 2022-01-25 LAB — I-STAT CHEM 8, ED
BUN: 20 mg/dL (ref 8–23)
Calcium, Ion: 1.08 mmol/L — ABNORMAL LOW (ref 1.15–1.40)
Chloride: 107 mmol/L (ref 98–111)
Creatinine, Ser: 0.9 mg/dL (ref 0.61–1.24)
Glucose, Bld: 180 mg/dL — ABNORMAL HIGH (ref 70–99)
HCT: 41 % (ref 39.0–52.0)
Hemoglobin: 13.9 g/dL (ref 13.0–17.0)
Potassium: 4.3 mmol/L (ref 3.5–5.1)
Sodium: 139 mmol/L (ref 135–145)
TCO2: 21 mmol/L — ABNORMAL LOW (ref 22–32)

## 2022-01-25 LAB — CBG MONITORING, ED: Glucose-Capillary: 166 mg/dL — ABNORMAL HIGH (ref 70–99)

## 2022-01-25 LAB — COMPREHENSIVE METABOLIC PANEL
ALT: 20 U/L (ref 0–44)
AST: 27 U/L (ref 15–41)
Albumin: 4.1 g/dL (ref 3.5–5.0)
Alkaline Phosphatase: 58 U/L (ref 38–126)
Anion gap: 13 (ref 5–15)
BUN: 17 mg/dL (ref 8–23)
CO2: 22 mmol/L (ref 22–32)
Calcium: 9.9 mg/dL (ref 8.9–10.3)
Chloride: 104 mmol/L (ref 98–111)
Creatinine, Ser: 1.04 mg/dL (ref 0.61–1.24)
GFR, Estimated: >60 mL/min (ref >60)
Glucose, Bld: 178 mg/dL — ABNORMAL HIGH (ref 70–99)
Potassium: 4.3 mmol/L (ref 3.5–5.1)
Sodium: 139 mmol/L (ref 135–145)
Total Bilirubin: 0.6 mg/dL (ref 0.3–1.2)
Total Protein: 6.5 g/dL (ref 6.5–8.1)

## 2022-01-25 LAB — RAPID URINE DRUG SCREEN, HOSP PERFORMED
Amphetamines: NOT DETECTED
Barbiturates: NOT DETECTED
Benzodiazepines: NOT DETECTED
Cocaine: NOT DETECTED
Opiates: NOT DETECTED
Tetrahydrocannabinol: NOT DETECTED

## 2022-01-25 LAB — CBC
HCT: 43.7 % (ref 39.0–52.0)
Hemoglobin: 14.1 g/dL (ref 13.0–17.0)
MCH: 26.6 pg (ref 26.0–34.0)
MCHC: 32.3 g/dL (ref 30.0–36.0)
MCV: 82.5 fL (ref 80.0–100.0)
Platelets: 185 10*3/uL (ref 150–400)
RBC: 5.3 MIL/uL (ref 4.22–5.81)
RDW: 14.5 % (ref 11.5–15.5)
WBC: 9.8 10*3/uL (ref 4.0–10.5)
nRBC: 0 % (ref 0.0–0.2)

## 2022-01-25 LAB — PROTIME-INR
INR: 1.1 (ref 0.8–1.2)
Prothrombin Time: 13.7 seconds (ref 11.4–15.2)

## 2022-01-25 LAB — ETHANOL: Alcohol, Ethyl (B): <10 mg/dL (ref <10)

## 2022-01-25 LAB — APTT: aPTT: 26 seconds (ref 24–36)

## 2022-01-25 MED ORDER — INSULIN ASPART 100 UNIT/ML IJ SOLN
0.0000 [IU] | Freq: Three times a day (TID) | INTRAMUSCULAR | Status: DC
  Administered 2022-01-26: 5 [IU] via SUBCUTANEOUS
  Administered 2022-01-26 – 2022-01-27 (×4): 3 [IU] via SUBCUTANEOUS
  Administered 2022-01-27: 5 [IU] via SUBCUTANEOUS

## 2022-01-25 MED ORDER — SODIUM CHLORIDE 0.9 % IV SOLN: INTRAVENOUS | Status: DC

## 2022-01-25 MED ORDER — ACETAMINOPHEN 325 MG PO TABS: 650.0000 mg | ORAL_TABLET | ORAL | Status: DC | PRN

## 2022-01-25 MED ORDER — ACETAMINOPHEN 650 MG RE SUPP: 650.0000 mg | RECTAL | Status: DC | PRN

## 2022-01-25 MED ORDER — IOHEXOL 350 MG/ML SOLN
75.0000 mL | Freq: Once | INTRAVENOUS | Status: AC | PRN
  Administered 2022-01-25: 75 mL via INTRAVENOUS

## 2022-01-25 MED ORDER — ACETAMINOPHEN 160 MG/5ML PO SOLN: 650.0000 mg | ORAL | Status: DC | PRN

## 2022-01-25 MED ORDER — LABETALOL HCL 5 MG/ML IV SOLN: 10.0000 mg | Freq: Once | INTRAVENOUS | Status: DC | PRN

## 2022-01-25 MED ORDER — PANTOPRAZOLE SODIUM 40 MG IV SOLR
40.0000 mg | Freq: Every day | INTRAVENOUS | Status: DC
  Administered 2022-01-25: 40 mg via INTRAVENOUS
  Filled 2022-01-25: qty 10

## 2022-01-25 MED ORDER — STROKE: EARLY STAGES OF RECOVERY BOOK: Freq: Once | Status: AC

## 2022-01-25 MED ORDER — TENECTEPLASE FOR STROKE
0.2500 mg/kg | PACK | Freq: Once | INTRAVENOUS | Status: AC
  Administered 2022-01-25: 20 mg via INTRAVENOUS
  Filled 2022-01-25: qty 10

## 2022-01-25 MED ORDER — ATORVASTATIN CALCIUM 10 MG PO TABS
20.0000 mg | ORAL_TABLET | Freq: Every day | ORAL | Status: DC
  Administered 2022-01-26: 20 mg via ORAL
  Filled 2022-01-25: qty 2

## 2022-01-25 MED ORDER — FINASTERIDE 5 MG PO TABS
5.0000 mg | ORAL_TABLET | Freq: Every day | ORAL | Status: DC
  Administered 2022-01-26 – 2022-01-27 (×2): 5 mg via ORAL
  Filled 2022-01-25 (×2): qty 1

## 2022-01-25 MED ORDER — NICARDIPINE HCL IN NACL 20-0.86 MG/200ML-% IV SOLN: 0.0000 mg/h | INTRAVENOUS | Status: DC | PRN

## 2022-01-25 NOTE — ED Provider Notes (Signed)
[facility] EMERGENCY DEPARTMENT Provider Note   CSN: [id number] Arrival date & time: 01/25/22  1357     History  Chief Complaint  Patient presents with   Code Stroke    Gavin [name] is a 76 y.o. male.  HPI Patient had just finished lunch and was paying the bill at a restaurant.  At 1255 he noted that his mouth started to feel tingly and numb on the left side.  This then also encompassed his left arm and leg.  He generally did not feel well when the symptoms started.  He denies he had a headache or chest pain.  Patient transported by EMS.  EMS noted some possible slight left arm weakness with pronator drift.  But it also seemed to resolve quite quickly.  Patient did not have loss of consciousness.  He denies any prior history of stroke.  His medical history is hypertension, diabetes and hypercholesterolemia.  He does not take anticoagulant medications.  Patient is companion reports she did not notice any change in the patient's speech or mentation before he reported tingling and decreased sensation in his face.    Home Medications Prior to Admission medications   Medication Sig Start Date End Date Taking? Authorizing Provider  aspirin 81 MG tablet Take 81 mg by mouth daily.   Yes [provider]  atorvastatin (LIPITOR) 20 MG tablet Take 20 mg by mouth daily.   Yes [provider]  finasteride (PROSCAR) 5 MG tablet Take 5 mg by mouth daily.  11/09/14  Yes [provider]  glimepiride (AMARYL) 1 MG tablet Take 1 mg by mouth daily.   Yes [provider]  losartan (COZAAR) 50 MG tablet Take 50 mg by mouth daily.   Yes [provider]  metFORMIN (GLUCOPHAGE) 850 MG tablet Take 1 tablet (850 mg total) by mouth 3 (three) times daily. Resume on Saturday 12/20/2011 secondary to contrast study performed on  12/17/2011. Patient taking differently: Take 1,000 mg by mouth 2 (two) times daily with a meal. 12/18/11  Yes Altha Harm, MD  NOVOLOG FLEXPEN 100 UNIT/ML FlexPen Inject 16 Units into the skin in the morning.   Yes [provider]  omeprazole (PRILOSEC) 20 MG capsule Take 20 mg by mouth daily. 01/16/22  Yes [provider]  sildenafil (VIAGRA) 50 MG tablet Take 50 mg by mouth daily as needed. 11/05/21  Yes [provider]  tamsulosin (FLOMAX) 0.4 MG CAPS capsule Take 0.4 mg by mouth daily. 01/21/22  Yes [provider]  TOUJEO SOLOSTAR 300 UNIT/ML SOPN Inject 30 Units into the skin at bedtime. 06/28/18  Yes [provider]  mometasone (ELOCON) 0.1 % lotion Apply 1 Application topically 3 (three) times a week. Patient not taking: Reported on 01/25/2022    [provider]      Allergies    Altace [ramipril] and Cortizone-10 [hydrocortisone]    Review of Systems   Review of Systems  Physical Exam Updated Vital Signs BP (!) 142/92   Pulse 88   Temp 98.7 F (37.1 C) (Temporal)   Resp 17   Wt 78.6 kg   SpO2 98%   BMI 28.84 kg/m  Physical Exam Constitutional:      Comments: Alert with clear mental status.  Well-nourished well-developed.  No respiratory distress.  HENT:     Head: Normocephalic and atraumatic.     Mouth/Throat:     Pharynx: Oropharynx is clear.  Eyes:     Extraocular Movements: Extraocular  movements intact.  Cardiovascular:     Rate and Rhythm: Normal rate and regular rhythm.  Pulmonary:     Effort: Pulmonary effort is normal.     Breath sounds: Normal breath sounds.  Abdominal:     General: There is no distension.     Palpations: Abdomen is soft.     Tenderness: There is no abdominal tenderness. There is no guarding.  Musculoskeletal:        General: No swelling. Normal range of motion.     Right lower leg: No edema.     Left lower leg: No edema.  Skin:    General: Skin is warm and dry.  Neurological:     Comments: Patient is alert.  Speech is not slurred.  No evidence of receptive or expressive aphasia.  Cranial  nerves II through XII appear intact.  Subtle left hand grip weakness relative to right.  With finger-nose exam patient appears to have a slight dysmetria on the left.  Patient can elevate both lower extremities off the bed and hold.  He can also hold against resistance but some subtle weakness on the left against downward pressure.  Subjectively patient does feel it slightly more difficult with his coordination and strength on the left.  Light touch patient has subjective decreased sensation on the left face and leg.  Psychiatric:        Mood and Affect: Mood normal.     ED Results / Procedures / Treatments   Labs (all labs ordered are listed, but only abnormal results are displayed) Labs Reviewed  COMPREHENSIVE METABOLIC PANEL - Abnormal; Notable for the following components:      Result Value   Glucose, Bld 178 (*)    All other components within normal limits  I-STAT CHEM 8, ED - Abnormal; Notable for the following components:   Glucose, Bld 180 (*)    Calcium, Ion 1.08 (*)    TCO2 21 (*)    All other components within normal limits  ETHANOL  PROTIME-INR  APTT  CBC  DIFFERENTIAL  RAPID URINE DRUG SCREEN, HOSP PERFORMED  URINALYSIS, ROUTINE W REFLEX MICROSCOPIC    EKG EKG Interpretation  Date/Time:  Saturday January 25 2022 14:27:13 EST Ventricular Rate:  96 PR Interval:  198 QRS Duration: 96 QT Interval:  366 QTC Calculation: 463 R Axis:   44 Text Interpretation: Sinus rhythm normal, no sig change from previous Confirmed by Arby Barrette [phone]) on 01/25/2022 4:27:24 PM  Radiology CT ANGIO HEAD NECK W WO CM (CODE STROKE)  Result Date: 01/25/2022 CLINICAL DATA:  Code stroke EXAM: CT ANGIOGRAPHY HEAD AND NECK TECHNIQUE: Multidetector CT imaging of the head and neck was performed using the standard protocol during bolus administration of intravenous contrast. Multiplanar CT image reconstructions and MIPs were obtained to evaluate the vascular anatomy. Carotid stenosis  measurements (when applicable) are obtained utilizing NASCET criteria, using the distal internal carotid diameter as the denominator. RADIATION DOSE REDUCTION: This exam was performed according to the departmental dose-optimization program which includes automated exposure control, adjustment of the mA and/or kV according to patient size and/or use of iterative reconstruction technique. CONTRAST:  5mL OMNIPAQUE IOHEXOL 350 MG/ML SOLN COMPARISON:  Same-day noncontrast head CT, CTA neck 12/17/2011, MRA head and neck 12/16/2011. FINDINGS: CTA NECK FINDINGS Aortic arch: There is mild calcified plaque in the aortic arch. The origins of the major branch vessels are patent. The subclavian arteries are patent to the level imaged. Right carotid system: The right common carotid artery is  patent. There is mixed plaque in the proximal internal carotid artery without hemodynamically significant stenosis relative to the distal vessel. The distal internal carotid artery is widely patent. The external carotid artery is patent. There is no dissection or aneurysm. Left carotid system: The left common, internal, and external carotid arteries are patent, with mild plaque at the bifurcation but no hemodynamically significant stenosis or occlusion. There is no dissection or aneurysm. Vertebral arteries: The vertebral arteries are patent, without hemodynamically significant stenosis or occlusion. There is no dissection or aneurysm. Skeleton: There is disc space narrowing and degenerative endplate change in the lower cervical spine. There is no acute osseous abnormality or suspicious osseous lesion. There is no visible canal hematoma. Other neck: The soft tissues of the neck are unremarkable. Upper chest: The imaged lung apices are clear. Review of the MIP images confirms the above findings CTA HEAD FINDINGS Anterior circulation: There is mild calcified plaque in the carotid siphons without significant stenosis or occlusion. The bilateral  MCAs are patent, without proximal stenosis or occlusion. The bilateral ACAs are patent, without proximal stenosis or occlusion. The right A1 segment is hypoplastic/absent, a normal variant. There is no aneurysm or AVM. Posterior circulation: The bilateral V4 segments are patent. The basilar artery is patent. The major cerebellar arteries appear patent. The bilateral PCAs are patent, without proximal stenosis or occlusion. There is a fetal origin of the right PCA. A diminutive left posterior communicating artery is also identified. There is no aneurysm or AVM. Venous sinuses: Patent. Anatomic variants: As above. Review of the MIP images confirms the above findings IMPRESSION: 1. No emergent large vessel occlusion. 2. Atherosclerotic plaque at the carotid bifurcations, right more than left, without hemodynamically significant stenosis or occlusion. Findings communicated to Dr. Amada JupiterKirkpatrick via Amion at 2:30 p.m. Electronically Signed   By: Lesia HausenPeter  Noone M.D.   On: 01/25/2022 14:32   CT HEAD CODE STROKE WO CONTRAST  Result Date: 01/25/2022 CLINICAL DATA:  Code stroke. EXAM: CT HEAD WITHOUT CONTRAST TECHNIQUE: Contiguous axial images were obtained from the base of the skull through the vertex without intravenous contrast. RADIATION DOSE REDUCTION: This exam was performed according to the departmental dose-optimization program which includes automated exposure control, adjustment of the mA and/or kV according to patient size and/or use of iterative reconstruction technique. COMPARISON:  Head CT 12/16/2011 FINDINGS: Brain: There is no acute intracranial hemorrhage, extra-axial fluid collection, or acute infarct. Parenchymal volume is within expected limits for age. The ventricles are normal in size. Gray-white differentiation is preserved. There is mild background chronic small-vessel ischemic change. There is no mass lesion.  There is no mass effect or midline shift. Vascular: There is calcification of the bilateral  carotid siphons. Skull: Normal. Negative for fracture or focal lesion. Sinuses/Orbits: There is mild mucosal thickening in the paranasal sinuses. Bilateral lens implants are in place. The globes and orbits are otherwise unremarkable. Other: None. ASPECTS [facility]([place] Stroke Program Early CT Score) - Ganglionic level infarction (caudate, lentiform nuclei, internal capsule, insula, M1-M3 cortex): 7 - Supraganglionic infarction (M4-M6 cortex): 3 Total score (0-10 with 10 being normal): 10 IMPRESSION: 1. No acute intracranial pathology. 2. ASPECTS is 10 Findings communicated via Amion to Dr. Amada JupiterKirkpatrick at 2:11 p.m. Electronically Signed   By: Lesia HausenPeter  Noone M.D.   On: 01/25/2022 14:13    Procedures Procedures    Medications Ordered in ED Medications  iohexol (OMNIPAQUE) 350 MG/ML injection 75 mL (75 mLs Intravenous Contrast Given 01/25/22 1419)  tenecteplase (TNKASE) injection for Stroke 20  mg (20 mg Intravenous Given 01/25/22 1428)    ED Course/ Medical Decision Making/ A&P                           Medical Decision Making Amount and/or Complexity of Data Reviewed Labs: ordered. Radiology: ordered.  Risk Decision regarding hospitalization.   Patient presents as outlined.  He had acute onset of neurologic symptoms.  On physical exam patient has subtle differential of strength from right to left.  At this time will initiate code stroke.  He is intact.  Patient's heart is regular.  No respiratory distress.  Stable for transport to CT.  Patient's blood pressures are normotensive.  No signs of hypertensive encephalopathy.  Mental status is clear without history or significant signs of infectious etiology.  All basic lab work within normal limits.  CBC with differential is normal.  Patient has mild hyperglycemia but normal renal function, alcohol and UDS negative.  EKG interpreted by myself normal with no significant change from previous.  Seen by neurology and CT exams obtained.  I have reviewed  radiology interpretations for no acute bleed or other acute abnormality identified on CT.  Neurology has administered tPA.  Patient will be admitted for acute stroke management.          Final Clinical Impression(s) / ED Diagnoses Final diagnoses:  Cerebrovascular accident (CVA), unspecified mechanism (HCC)    Rx / DC Orders ED Discharge Orders     None         Arby Barrette, MD 01/25/22 1627

## 2022-01-25 NOTE — Progress Notes (Signed)
  Tenecteplase 20mg  IV x1  Notified to mix at 1424 by Ready for admin at 1426 Administered 1427   Notable delays: delay in paging code stroke out after patient arrival at the bridge   Amada Jupiter, PharmD Clinical Pharmacist 01/25/2022 2:31 PM

## 2022-01-25 NOTE — H&P (Signed)
NEUROLOGY CONSULTATION NOTE   Date of service: January 25, 2022 Patient Name: Gavin [name] MRN:  [id number] DOB:  Sep 12, 1945 Reason for consult: "L side numbness" Requesting physician: "Dr. Donnald Garre" _ _ _   _ __   _ __ _ _  __ __   _ __   __ _  History of Present Illness   Gavin [name] is a 76 y.o. male with PMH significant for  has a past medical history of Diabetes mellitus without complication (HCC), HLD (hyperlipidemia), and Shingles. who presents with  L face, arm, and leg numbness.  Pt was having lunch with wife when he had a sudden onset of L mouth numbness, which proceeded down his L arm and then L leg.  When we attempted to have him walk from the MRI table to the stretcher he noted his gait had worsened since he walked out to the parking lot at the restaurant after his symptoms began.  This is very concerning for stroke.    Not on any anticoagulants No Hx of CVA, no Hx of GI bleed, no recent Sx Compliant with current medications per pt and wife report Risk factors DM2, HTN, HLD  Pt was within the window and eligible for TNK.  Discussion of risks and benefits with the wife and patient.  They agreed to take the medication.  Last known Well:12:55pm 01/25/2022 On anticoagulant: No NIHSS on arrival: 3 CT Head: unremarkable TNK: administered at 14:27pm  01/25/2022 CTA Head/Neck: negative for large vessel occlusion (LVO) Baseline Functional Status: normal, no deficits  All CNS imaging personally reviewed.   ROS   all other systems reviewed and are negative   Past History   I have reviewed the following:  Past Medical History:  Past Medical History:  Diagnosis Date   Diabetes mellitus without complication (HCC)    HLD (hyperlipidemia)    Shingles     No family history on file. Family History  Problem Relation Age of Onset   Broken bones Mother    Diabetes Father     Allergies:  Allergies  Allergen Reactions   Altace [Ramipril] Anaphylaxis    Cortizone-10 [Hydrocortisone] Other (See Comments)    Vision problems    Social History:   reports that he has quit smoking. His smoking use included cigarettes. He has never used smokeless tobacco. He reports current alcohol use. He reports that he does not use drugs.   Rare ETOH use Former smoker, quit in 1970's   Medications   (Not in a hospital admission)    No current facility-administered medications for this encounter.  Current Outpatient Medications:    aspirin 81 MG tablet, Take 81 mg by mouth daily., Disp: , Rfl:    atorvastatin (LIPITOR) 20 MG tablet, Take 20 mg by mouth daily., Disp: , Rfl:    finasteride (PROSCAR) 5 MG tablet, Take 5 mg by mouth daily. , Disp: , Rfl:    glimepiride (AMARYL) 1 MG tablet, Take 1 mg by mouth daily., Disp: , Rfl:    losartan (COZAAR) 50 MG tablet, Take 50 mg by mouth daily., Disp: , Rfl:    metFORMIN (GLUCOPHAGE) 850 MG tablet, Take 1 tablet (850 mg total) by mouth 3 (three) times daily. Resume on Saturday 12/20/2011 secondary to contrast study performed on  12/17/2011. (Patient taking differently: Take 1,000 mg by mouth 2 (two) times daily with a meal.), Disp: , Rfl:    NOVOLOG FLEXPEN 100 UNIT/ML FlexPen, Inject 16 Units into the skin in the morning., Disp: ,  Rfl:    omeprazole (PRILOSEC) 20 MG capsule, Take 20 mg by mouth daily., Disp: , Rfl:    sildenafil (VIAGRA) 50 MG tablet, Take 50 mg by mouth daily as needed., Disp: , Rfl:    tamsulosin (FLOMAX) 0.4 MG CAPS capsule, Take 0.4 mg by mouth daily., Disp: , Rfl:    TOUJEO SOLOSTAR 300 UNIT/ML SOPN, Inject 30 Units into the skin at bedtime., Disp: , Rfl:   Vitals   Vitals:   01/25/22 1500 01/25/22 1515 01/25/22 1530 01/25/22 1545  BP: (!) 144/94 (!) 158/66 (!) 153/72 (!) 142/92  Pulse: 92 93 87 88  Resp: (!) 21 17 18 17   Temp:      TempSrc:      SpO2: 98% 98% 99% 98%  Weight:         Body mass index is 28.84 kg/m.  Physical Exam   Examination: GENERAL: Awake, alert in  NAD HEENT: Normocephalic and atraumatic, dry mm, no lymphadenopathy, no Thyromegally LUNGS: Clear to auscultation bilaterally CV: S1S2 RRR, equal pulses bilaterally. ABDOMEN: Soft, nontender, nondistended with normoactive BS Ext: warm, well perfused, intact peripheral pulses, no pedal edema  NEURO:  Mental Status: AA&Ox3 Language: speech is appropriate, with intact naming, repetition, fluency, and comprehension. Cranial Nerves:  II: PERRL. Visual fields full to confrontation.  III, IV, VI: EOM in tact. Eyelids elevate symmetrically. Blinks to threat.  V: Sensation intact V1-3 symmetrically  VII: no facial asymmetry   VIII: hearing intact to voice IX, X: Palate elevates symmetrically. Phonation is normal.  UX:[id number]:Shoulder shrug 5/5 and symmetrical  XII: tongue is midline without fasciculations. Motor:  RUE: grip   5/5   biceps  5/5   triceps  5/5      LUE: grip   5/5    biceps  5-/5  triceps  5/5 RLE: thigh  5/5    knee   5/5   plantar flexion  5/5   dorsiflexion  5/5        LLE:  thigh  5/5    knee   5/5   plantar flexion  5/5   dorsiflexion  5/5 Tone: normal and bulk is normal DTRs: 2+ and symmetrical throughout Sensory:  - Intact to light touch, Symmetric.   Coordination/Complex Motor:  - Finger to Nose (FNF) intact BL - mild ataxia LUE - Heel to shin intact BL - Rapid alternating movement are normal - No abnormal Movt - Gait: ataxic gait, only able to take 3-4 steps with 2 person assist from MRI table to stretcher Base width wide.    NIHSS:  1a Level of Conscious.: 0 1b LOC Questions: 0 1c LOC Commands: 0 2 Best Gaze: 0 3 Visual: 0 4 Facial Palsy: 1 5a Motor Arm - left: 0 5b Motor Arm - Right: 0 6a Motor Leg - Left: 0 6b Motor Leg - Right: 0 7 Limb Ataxia: 1 8 Sensory: 1 9 Best Language: 0 10 Dysarthria: 0 11 Extinct. and Inatten.: 0       TOTAL: 3  Premorbid modified Rankin scale (mRS): 01/25/2022, Score: 4 0-Completely asymptomatic and back to baseline  post-stroke 1-No significant post stroke disability and can perform usual duties with stroke symptoms 2-Slight disability-UNABLE to perform all activities but does not need assistance  3-Moderate disability-requires help but walks WITHOUT assistance 4-Needs assistance to walk and tend to bodily needs 5-Severe disability-bedridden, incontinent, needs constant attention 6- Death  Labs & Imaging  I have reviewed labs in epic and the results pertinent to  this consultation are:  BMP:  Lab Results  Component Value Date   NA 139 01/25/2022   K 4.3 01/25/2022   CO2 22 01/25/2022   GLUCOSE 180 (H) 01/25/2022   BUN 20 01/25/2022   CREATININE 0.90 01/25/2022   CALCIUM 9.9 01/25/2022   GFRNONAA >60 01/25/2022   GFRAA >90 12/16/2011     Lab Results  Component Value Date   NA 139 01/25/2022   K 4.3 01/25/2022   CL 107 01/25/2022   CO2 22 01/25/2022   Liver Fx Panel:  Lab Results  Component Value Date   ALT 20 01/25/2022   AST 27 01/25/2022   ALKPHOS 58 01/25/2022   BILITOT 0.6 01/25/2022   WBC:  Lab Results  Component Value Date   WBC 9.8 01/25/2022   HGB 13.9 01/25/2022   HCT 41.0 01/25/2022   MCV 82.5 01/25/2022   PLT 185 01/25/2022   Lipid Panel:  Lab Results  Component Value Date   LDLCALC 65 12/17/2011   A1C:  Lab Results  Component Value Date   HGBA1C 7.6 06/03/2018   B12:  Lab Results  Component Value Date   VITAMINB12 531 12/16/2011   Folate: No results found for: "FOLATE" Urine Drug Screen:     Component Value Date/Time   LABOPIA NONE DETECTED 01/25/2022 1359   COCAINSCRNUR NONE DETECTED 01/25/2022 1359   LABBENZ NONE DETECTED 01/25/2022 1359   AMPHETMU NONE DETECTED 01/25/2022 1359   THCU NONE DETECTED 01/25/2022 1359   LABBARB NONE DETECTED 01/25/2022 1359    Alcohol Level     Component Value Date/Time   ETH <10 01/25/2022 1354    ___________________________________________________________________ I have reviewed the images obtained:,  listed below  CT Head w/o unremarkable   CTA Head/ with contrast LVO(-)   Impression    Likely small stroke  Recommendations    Stroke/TIA Workup  - Admit for stroke workup - MRI brain wo contrast - STAT head CT for any change in neuro exam - Frequent Neuro checks per stroke unit protocol - Recommend obtaining TTE with bubble - Recommend obtaining Lipid panel with LDL, and A1C - Please start statin if LDL > 70 - Antithrombotic -none for 24 hours - SBP Goal: 180/105 - Recommend Telemetry monitoring for arrythmia - Recommend bedside swallow screen prior to PO intake. - Recommend PT/OT/SLP consult - Stroke education - Amb referral to neurology upon discharge  ______________________________________________________________________   Thank you for the opportunity to take part in the care of this patient. If you have any further questions, please contact the neurology consultation attending.  Signed,  Allena Napoleon, AGNP Neurology Hospitalist Ph [phone]  This note will be edited by MD as needed.   If 7pm- 7am, please page neurology on call as listed in AMION.    I was present for, and jointly formulating, the entirety of the evaluation and management reflected in the above note.  Though his NIH is relatively low, he had significant difficulty with ambulation which I do feel would be a disabling deficit.  In the setting, I discussed risks, benefits, and alternatives to IV tenecteplase.  He wanted to discuss with his wife, so there was some delay in transporting back to the room to discuss it with both of them.  After discussion, they did decide to proceed with IV tenecteplase.  This patient is critically ill and at significant risk of neurological worsening, death and care requires constant monitoring of vital signs, hemodynamics,respiratory and cardiac monitoring, neurological assessment, discussion with family,  other specialists and medical decision making of  high complexity. I spent 34 minutes of neurocritical care time  in the care of  this patient. This was time spent independent of any time provided by nurse practitioner or PA.  Ritta Slot, MD [facility] [phone]  If 7pm- 7am, please page neurology on call as listed in AMION. 01/25/2022  7:23 PM

## 2022-01-25 NOTE — ED Notes (Signed)
Patient taken to CT scan at this time.

## 2022-01-25 NOTE — ED Triage Notes (Addendum)
Pt arrived via [facility] for c/o lips numb and numbness in left arm and leg after pt ate lunch wit wife. LKW 1255 today. Pt went across the street from where they were to an UC and the UC called 911. Pt is A&Ox4.

## 2022-01-25 NOTE — ED Notes (Signed)
Spoke with neurology MD regarding admission orders.

## 2022-01-26 ENCOUNTER — Inpatient Hospital Stay (HOSPITAL_COMMUNITY): Payer: Medicare (Managed Care)

## 2022-01-26 DIAGNOSIS — I6389 Other cerebral infarction: Secondary | ICD-10-CM

## 2022-01-26 DIAGNOSIS — I63331 Cerebral infarction due to thrombosis of right posterior cerebral artery: Secondary | ICD-10-CM | POA: Diagnosis not present

## 2022-01-26 LAB — LIPID PANEL
Cholesterol: 127 mg/dL (ref 0–200)
HDL: 41 mg/dL (ref >40)
LDL Cholesterol: 78 mg/dL (ref 0–99)
Total CHOL/HDL Ratio: 3.1 RATIO
Triglycerides: 42 mg/dL (ref <150)
VLDL: 8 mg/dL (ref 0–40)

## 2022-01-26 LAB — ECHOCARDIOGRAM COMPLETE
AR max vel: 2.69 cm2
AV Area VTI: 2.43 cm2
AV Area mean vel: 2.53 cm2
AV Mean grad: 2 mmHg
AV Peak grad: 4.7 mmHg
Ao pk vel: 1.08 m/s
Area-P 1/2: 2.37 cm2
Calc EF: 51 %
S' Lateral: 3.1 cm
Single Plane A2C EF: 55.2 %
Single Plane A4C EF: 47.8 %
Weight: 2694.9 oz

## 2022-01-26 LAB — GLUCOSE, CAPILLARY
Glucose-Capillary: 152 mg/dL — ABNORMAL HIGH (ref 70–99)
Glucose-Capillary: 158 mg/dL — ABNORMAL HIGH (ref 70–99)
Glucose-Capillary: 168 mg/dL — ABNORMAL HIGH (ref 70–99)
Glucose-Capillary: 194 mg/dL — ABNORMAL HIGH (ref 70–99)
Glucose-Capillary: 250 mg/dL — ABNORMAL HIGH (ref 70–99)
Glucose-Capillary: 256 mg/dL — ABNORMAL HIGH (ref 70–99)

## 2022-01-26 LAB — MRSA NEXT GEN BY PCR, NASAL: MRSA by PCR Next Gen: NOT DETECTED

## 2022-01-26 MED ORDER — ATORVASTATIN CALCIUM 40 MG PO TABS
40.0000 mg | ORAL_TABLET | Freq: Every day | ORAL | Status: DC
  Administered 2022-01-27: 40 mg via ORAL
  Filled 2022-01-26: qty 1

## 2022-01-26 MED ORDER — ASPIRIN 81 MG PO CHEW
81.0000 mg | CHEWABLE_TABLET | Freq: Every day | ORAL | Status: DC
  Administered 2022-01-26 – 2022-01-27 (×2): 81 mg via ORAL
  Filled 2022-01-26 (×2): qty 1

## 2022-01-26 MED ORDER — ENOXAPARIN SODIUM 30 MG/0.3ML IJ SOSY: 30.0000 mg | PREFILLED_SYRINGE | INTRAMUSCULAR | Status: DC

## 2022-01-26 MED ORDER — CLOPIDOGREL BISULFATE 75 MG PO TABS
75.0000 mg | ORAL_TABLET | Freq: Every day | ORAL | Status: DC
  Administered 2022-01-26 – 2022-01-27 (×2): 75 mg via ORAL
  Filled 2022-01-26 (×2): qty 1

## 2022-01-26 MED ORDER — CHLORHEXIDINE GLUCONATE CLOTH 2 % EX PADS
6.0000 | MEDICATED_PAD | Freq: Every day | CUTANEOUS | Status: DC
  Administered 2022-01-26 – 2022-01-27 (×2): 6 via TOPICAL

## 2022-01-26 MED ORDER — PANTOPRAZOLE SODIUM 40 MG PO TBEC
40.0000 mg | DELAYED_RELEASE_TABLET | Freq: Every day | ORAL | Status: DC
  Administered 2022-01-26 – 2022-01-27 (×2): 40 mg via ORAL
  Filled 2022-01-26 (×2): qty 1

## 2022-01-26 MED ORDER — ENOXAPARIN SODIUM 40 MG/0.4ML IJ SOSY
40.0000 mg | PREFILLED_SYRINGE | INTRAMUSCULAR | Status: DC
  Administered 2022-01-26: 40 mg via SUBCUTANEOUS
  Filled 2022-01-26: qty 0.4

## 2022-01-26 NOTE — Progress Notes (Signed)
Pt arrives from ER with the following personal belongings:  - 1 pair of glasses - 1 black phone - 2 black shoes - 1 black jacket - 2 white socks

## 2022-01-26 NOTE — Progress Notes (Signed)
  Echocardiogram 2D Echocardiogram has been performed.  Gerda Diss 01/26/2022, 4:29 PM

## 2022-01-26 NOTE — Evaluation (Signed)
Occupational Therapy Evaluation and Discharge Patient Details Name: Gavin [name] MRN: [id number] DOB: 06/11/1945 Today's Date: 01/26/2022   History of Present Illness Pt is 76 year old presented to [facility] on  01/25/22 with lt face and arm numbness and developed worsening gait in ED. CT negative. Concerning for stroke and given TNK. 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 PMH - DM, shingles   Clinical Impression   This 76 yo male admitted and found to have above presents to acute OT with PLOF of being totally independent with all basic ADLs, IADLs, and driving. Currently he appears back to his baseline based off evaluation in this environment. He is a little impulsive/moves fast but this may be his baseline. No further OT needs, we will sign off.      Recommendations for follow up therapy are one component of a multi-disciplinary discharge planning process, led by the attending physician.  Recommendations may be updated based on patient status, additional functional criteria and insurance authorization.   Follow Up Recommendations  No OT follow up     Assistance Recommended at Discharge PRN     Functional Status Assessment  Patient has not had a recent decline in their functional status  Equipment Recommendations  None recommended by OT       Precautions / Restrictions Precautions Precautions: Fall Precaution Comments: bad knees Restrictions Weight Bearing Restrictions: No      Mobility Bed Mobility Overal bed mobility: Independent                  Transfers Overall transfer level: Independent Equipment used: None Transfers: Sit to/from Stand Sit to Stand: Independent                  Balance Overall balance assessment: Mild deficits observed, not formally tested                                         ADL either performed or assessed with clinical judgement   ADL  Overall ADL's : Independent                                             Vision Patient Visual Report: No change from baseline Vision Assessment?: Yes Eye Alignment: Within Functional Limits Ocular Range of Motion: Within Functional Limits Alignment/Gaze Preference: Within Defined Limits Tracking/Visual Pursuits: Able to track stimulus in all quads without difficulty Saccades: Additional eye shifts occurred during testing Convergence: Within functional limits Visual Fields: No apparent deficits            Pertinent Vitals/Pain Pain Assessment Pain Assessment: Faces Faces Pain Scale: Hurts a little bit Pain Location: bilateral knees lt>rt Pain Descriptors / Indicators: Aching, Shooting Pain Intervention(s): Limited activity within patient's tolerance, Monitored during session, Repositioned     Hand Dominance Right   Extremity/Trunk Assessment Upper Extremity Assessment Upper Extremity Assessment: Overall WFL for tasks assessed           Communication Communication Communication: No difficulties   Cognition Arousal/Alertness: Awake/alert Behavior During Therapy: Impulsive  General Comments: able to display divided attention with trying to get his phone charger set up with directing his wife and answering my questions but seemed what I would call "a little frenzied" with doing these both at same time     General Comments  VSS stable on RA; reports feels more balanced for the longer distances with shoes on (which he did have on this time to walk around unit 2MW)            Home Living Family/patient expects to be discharged to:: Private residence Living Arrangements: Spouse/significant other Available Help at Discharge: Family;Available 24 hours/day Type of Home: Apartment Home Access: Elevator     Home Layout: One level     Bathroom Shower/Tub: Occupational psychologist: Standard      Home Equipment: None          Prior Functioning/Environment Prior Level of Function : Independent/Modified Independent;Driving                                 OT Goals(Current goals can be found in the care plan section) Acute Rehab OT Goals Patient Stated Goal: to be able to get back to [place] (is suppose to fly back on Tuesday of this week)         AM-PAC OT "6 Clicks" Daily Activity     Outcome Measure Help from another person eating meals?: None Help from another person taking care of personal grooming?: None Help from another person toileting, which includes using toliet, bedpan, or urinal?: None Help from another person bathing (including washing, rinsing, drying)?: None Help from another person to put on and taking off regular upper body clothing?: None Help from another person to put on and taking off regular lower body clothing?: None 6 Click Score: 24   End of Session Equipment Utilized During Treatment: Gait belt Nurse Communication: Mobility status  Activity Tolerance: Patient tolerated treatment well Patient left: in chair;with call bell/phone within reach;with chair alarm set;with family/visitor present  OT Visit Diagnosis: Other abnormalities of gait and mobility (R26.89) (pre-existing due to bad knees)                Time: [id number] OT Time Calculation (min): 22 min Charges:  OT General Charges $OT Visit: 1 Visit OT Evaluation $OT Eval Moderate Complexity: 1 Mod  Gold[place] Circle, OTR/L Acute Rehab Services [facility] [phone] Office [phone]    Almon Register 01/26/2022, 5:35 PM

## 2022-01-26 NOTE — Progress Notes (Addendum)
STROKE TEAM PROGRESS NOTE   INTERVAL HISTORY His wife is at the bedside.  He is sitting in the chair in NAD. He states he still has some mild numbness around the left side of his mouth No new neurological events overnight  MRI 24 hr brain imaging s/p TNK with Acute infarct in the right thalamus and punctate acute infarct in the high right frontal lobe cortex. Will start ASA and Lovenox  Transfer to the floor   Vitals:   01/26/22 0626 01/26/22 0628 01/26/22 0700 01/26/22 0800  BP: (!) 164/99 (!) 157/79 (!) 159/80 (!) 157/74  Pulse:  72  79  Resp:  19 (!) 22 20  Temp:   98.2 F (36.8 C)   TempSrc:   Oral   SpO2:  98%  98%  Weight:  76.4 kg     CBC:  Recent Labs  Lab 01/25/22 1354 01/25/22 1405  WBC 9.8  --   NEUTROABS 6.0  --   HGB 14.1 13.9  HCT 43.7 41.0  MCV 82.5  --   PLT 185  --    Basic Metabolic Panel:  Recent Labs  Lab 01/25/22 1354 01/25/22 1405  NA 139 139  K 4.3 4.3  CL 104 107  CO2 22  --   GLUCOSE 178* 180*  BUN 17 20  CREATININE 1.04 0.90  CALCIUM 9.9  --    Lipid Panel:  Recent Labs  Lab 01/26/22 0636  CHOL 127  TRIG 42  HDL 41  CHOLHDL 3.1  VLDL 8  LDLCALC 78   HgbA1c: No results for input(s): "HGBA1C" in the last 168 hours. Urine Drug Screen:  Recent Labs  Lab 01/25/22 1359  LABOPIA NONE DETECTED  COCAINSCRNUR NONE DETECTED  LABBENZ NONE DETECTED  AMPHETMU NONE DETECTED  THCU NONE DETECTED  LABBARB NONE DETECTED    Alcohol Level  Recent Labs  Lab 01/25/22 1354  ETH <10    IMAGING past 24 hours CT HEAD WO CONTRAST  Result Date: 01/25/2022 CLINICAL DATA:  Stroke, follow-up EXAM: CT HEAD WITHOUT CONTRAST TECHNIQUE: Contiguous axial images were obtained from the base of the skull through the vertex without intravenous contrast. RADIATION DOSE REDUCTION: This exam was performed according to the departmental dose-optimization program which includes automated exposure control, adjustment of the mA and/or kV according to patient  size and/or use of iterative reconstruction technique. COMPARISON:  01/25/2022 2:06 p.m. FINDINGS: Brain: No evidence of acute infarction, hemorrhage, mass, mass effect, or midline shift. No hydrocephalus or extra-axial fluid collection. Vascular: No hyperdense vessel. Atherosclerotic calcifications in the intracranial carotid and vertebral arteries. Skull: Normal. Negative for fracture or focal lesion. Sinuses/Orbits: Mucosal thickening in the ethmoid air cells. Status post bilateral lens replacements. Other: The mastoid air cells are well aerated. IMPRESSION: No acute intracranial process. Electronically Signed   By: Merilyn Baba M.D.   On: 01/25/2022 21:02   CT ANGIO HEAD NECK W WO CM (CODE STROKE)  Result Date: 01/25/2022 CLINICAL DATA:  Code stroke EXAM: CT ANGIOGRAPHY HEAD AND NECK TECHNIQUE: Multidetector CT imaging of the head and neck was performed using the standard protocol during bolus administration of intravenous contrast. Multiplanar CT image reconstructions and MIPs were obtained to evaluate the vascular anatomy. Carotid stenosis measurements (when applicable) are obtained utilizing NASCET criteria, using the distal internal carotid diameter as the denominator. RADIATION DOSE REDUCTION: This exam was performed according to the departmental dose-optimization program which includes automated exposure control, adjustment of the mA and/or kV according to patient size and/or  use of iterative reconstruction technique. CONTRAST:  65mL OMNIPAQUE IOHEXOL 350 MG/ML SOLN COMPARISON:  Same-day noncontrast head CT, CTA neck 12/17/2011, MRA head and neck 12/16/2011. FINDINGS: CTA NECK FINDINGS Aortic arch: There is mild calcified plaque in the aortic arch. The origins of the major branch vessels are patent. The subclavian arteries are patent to the level imaged. Right carotid system: The right common carotid artery is patent. There is mixed plaque in the proximal internal carotid artery without  hemodynamically significant stenosis relative to the distal vessel. The distal internal carotid artery is widely patent. The external carotid artery is patent. There is no dissection or aneurysm. Left carotid system: The left common, internal, and external carotid arteries are patent, with mild plaque at the bifurcation but no hemodynamically significant stenosis or occlusion. There is no dissection or aneurysm. Vertebral arteries: The vertebral arteries are patent, without hemodynamically significant stenosis or occlusion. There is no dissection or aneurysm. Skeleton: There is disc space narrowing and degenerative endplate change in the lower cervical spine. There is no acute osseous abnormality or suspicious osseous lesion. There is no visible canal hematoma. Other neck: The soft tissues of the neck are unremarkable. Upper chest: The imaged lung apices are clear. Review of the MIP images confirms the above findings CTA HEAD FINDINGS Anterior circulation: There is mild calcified plaque in the carotid siphons without significant stenosis or occlusion. The bilateral MCAs are patent, without proximal stenosis or occlusion. The bilateral ACAs are patent, without proximal stenosis or occlusion. The right A1 segment is hypoplastic/absent, a normal variant. There is no aneurysm or AVM. Posterior circulation: The bilateral V4 segments are patent. The basilar artery is patent. The major cerebellar arteries appear patent. The bilateral PCAs are patent, without proximal stenosis or occlusion. There is a fetal origin of the right PCA. A diminutive left posterior communicating artery is also identified. There is no aneurysm or AVM. Venous sinuses: Patent. Anatomic variants: As above. Review of the MIP images confirms the above findings IMPRESSION: 1. No emergent large vessel occlusion. 2. Atherosclerotic plaque at the carotid bifurcations, right more than left, without hemodynamically significant stenosis or occlusion. Findings  communicated to Dr. Amada Jupiter via Amion at 2:30 p.m. Electronically Signed   By: Lesia Hausen M.D.   On: 01/25/2022 14:32   CT HEAD CODE STROKE WO CONTRAST  Result Date: 01/25/2022 CLINICAL DATA:  Code stroke. EXAM: CT HEAD WITHOUT CONTRAST TECHNIQUE: Contiguous axial images were obtained from the base of the skull through the vertex without intravenous contrast. RADIATION DOSE REDUCTION: This exam was performed according to the departmental dose-optimization program which includes automated exposure control, adjustment of the mA and/or kV according to patient size and/or use of iterative reconstruction technique. COMPARISON:  Head CT 12/16/2011 FINDINGS: Brain: There is no acute intracranial hemorrhage, extra-axial fluid collection, or acute infarct. Parenchymal volume is within expected limits for age. The ventricles are normal in size. Gray-white differentiation is preserved. There is mild background chronic small-vessel ischemic change. There is no mass lesion.  There is no mass effect or midline shift. Vascular: There is calcification of the bilateral carotid siphons. Skull: Normal. Negative for fracture or focal lesion. Sinuses/Orbits: There is mild mucosal thickening in the paranasal sinuses. Bilateral lens implants are in place. The globes and orbits are otherwise unremarkable. Other: None. ASPECTS [facility] Stroke Program Early CT Score) - Ganglionic level infarction (caudate, lentiform nuclei, internal capsule, insula, M1-M3 cortex): 7 - Supraganglionic infarction (M4-M6 cortex): 3 Total score (0-10 with 10 being normal): 10  IMPRESSION: 1. No acute intracranial pathology. 2. ASPECTS is 10 Findings communicated via Amion to Dr. Leonel Ramsay at 2:11 p.m. Electronically Signed   By: Valetta Mole M.D.   On: 01/25/2022 14:13    PHYSICAL EXAM  Temp:  [97.6 F (36.4 C)-98.5 F (36.9 C)] 97.6 F (36.4 C) (11/26 1100) Pulse Rate:  [14-92] 78 (11/26 1400) Resp:  [12-26] 17 (11/26 1400) BP:  (137-168)/(65-105) 155/86 (11/26 1400) SpO2:  [96 %-100 %] 96 % (11/26 1400) Weight:  [76.4 kg] 76.4 kg (11/26 0628)  General - Well nourished, well developed, in no apparent distress  Cardiovascular - Regular rhythm and rate.  Mental Status -  Level of arousal and orientation to time, place, and person were intact. Language including expression, naming, repetition, comprehension was assessed and found intact. Attention span and concentration were normal. Recent and remote memory were intact. Fund of Knowledge was assessed and was intact.  Cranial Nerves II - XII - II - Visual field intact OU. III, IV, VI - Extraocular movements intact. V - Facial sensation intact bilaterally. VII - Facial movement intact bilaterally. VIII - Hearing & vestibular intact bilaterally. X - Palate elevates symmetrically. XI - Chin turning & shoulder shrug intact bilaterally. XII - Tongue protrusion intact.  Motor Strength - The patient's strength was normal in all extremities and pronator drift was absent.  Bulk was normal and fasciculations were absent.   Motor Tone - Muscle tone was assessed at the neck and appendages and was normal.  Reflexes - The patient's reflexes were symmetrical in all extremities and he had no pathological reflexes.  Sensory - decreased on left side   Coordination - The patient had normal movements in the hands and feet with no ataxia or dysmetria.  Tremor was absent.  Gait and Station - deferred.  ASSESSMENT/PLAN Gavin [name] is a 76 y.o. male with history of  Diabetes mellitus without complication ([place]), HLD (hyperlipidemia), and Shingles. who presents with  L face, arm, and leg numbness. 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 No acute intracranial pathology. 2. ASPECTS is 10 CTA head & neck  1. No emergent large vessel occlusion. 2.  Atherosclerotic plaque at the carotid bifurcations, right more than left, without hemodynamically significant stenosis or occlusion. MRI  Acute infarct in the right thalamus and punctate acute infarct in the high right frontal lobe cortex. 2D Echo pending Given 2 separate stroke locations, recommend 30-day cardiac event monitoring with local PCP in [place] to rule out A-fib. LDL 78 HgbA1c pending VTE prophylaxis - Lovenox/ SCD's aspirin 81 mg daily prior to admission, now on aspirin 81 and Plavix 75 DAPT for 3 weeks and then placed alone. Therapy recommendations:  pending Disposition:  pending  Hypertension Home meds:  losartan 50mg ,  Stable Long-term BP goal normotensive  Hyperlipidemia Home meds:  lipitor 20mg  LDL 78, goal < 70 Increase Lipitor to 40 Continue statin at discharge  Diabetes type II Controlled Home meds:  glimepiride, metformin, insulin HgbA1c pending, goal < 7.0 CBGs SSI Close PCP follow-up  Other Stroke Risk Factors Advanced Age >/= 78   Other Active Problems BPh  Hospital day # 1  Beulah Gandy DNP, ACNPC-AG  [facility] Pager# [phone]   ATTENDING NOTE: I reviewed above note and agree with the assessment and plan. Pt was seen and examined.   76 year old male with  history of hypertension, hyperlipidemia, diabetes, shingles admitted for left-sided numbness and weakness.  CT no acute abnormality.  Status post TNK.  She had an active unremarkable.  CT repeat no acute abnormality.  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  EF 50 to 55%, LDL 78, A1c pending, UDS negative.  Creatinine 0.9.  On exam, wife at bedside.  Patient sitting in chair, AOx3.  No aphasia, fluent language, follows some commands.  Cranial nerves intact, left upper extremity slight drift and decreased handgrip and dexterity.  Otherwise no focal neurologic deficit.  Etiology for patient's stroke likely due to small vessel disease given location  and risk factors.  However, given 2 separate stroke locations, recommend 30-day cardiac event monitoring with his PCP/neurology in [place] to rule out A-fib.  Currently on aspirin and Plavix DAPT for 3 weeks and then Plavix alone.  Increase Lipitor from 20-40.  PT/OT no recommendation.   For detailed assessment and plan, please refer to above/below as I have made changes wherever appropriate.   Rosalin Hawking, MD PhD Stroke Neurology 01/26/2022 6:51 PM  This patient is critically ill due to acute stroke status post TNK and at significant risk of neurological worsening, death form recurrent stroke, hemorrhagic transmission, bleeding from TNK. This patient's care requires constant monitoring of vital signs, hemodynamics, respiratory and cardiac monitoring, review of multiple databases, neurological assessment, discussion with family, other specialists and medical decision making of high complexity. I spent 35 minutes of neurocritical care time in the care of this patient. I had long discussion with patient and wife at bedside, updated pt current condition, treatment plan and potential prognosis, and answered all the questions.  They expressed understanding and appreciation.      To contact Stroke Continuity provider, please refer to [email or web address] After hours, contact General Neurology

## 2022-01-26 NOTE — Progress Notes (Signed)
Pt has plane ticket back to home in [place] on Tuesday and wished to discuss with MD/ Case manager possibility of D/C in time to make flight or if he needs to reschedule.

## 2022-01-26 NOTE — Evaluation (Signed)
Physical Therapy Evaluation Patient Details Name: Gavin [name] MRN: [id number] DOB: September 25, 1945 Today's Date: 01/26/2022  History of Present Illness  Pt is 76 year old presented to [facility] on  01/25/22 with lt face and arm numbness and developed worsening gait in ED. CT negative. Concerning for stroke and given TNK. MRI pending. PMH - DM, shingles  Clinical Impression  Pt presents to PT with slight impairment in mobility due to decr sensation in LLE. Pt also with some impulsivity and decr attention but some of this may be baseline. Expect he will progress quickly with mobility and likely won't need PT after DC. Will follow acutely. From PT standpoint should be able to fly on Tuesday. Would recommend using w/c transport in airports and not try to walk through airports and manage bags etc.        Recommendations for follow up therapy are one component of a multi-disciplinary discharge planning process, led by the attending physician.  Recommendations may be updated based on patient status, additional functional criteria and insurance authorization.  Follow Up Recommendations No PT follow up      Assistance Recommended at Discharge Intermittent Supervision/Assistance  Patient can return home with the following       Equipment Recommendations None recommended by PT  Recommendations for Other Services       Functional Status Assessment Patient has had a recent decline in their functional status and demonstrates the ability to make significant improvements in function in a reasonable and predictable amount of time.     Precautions / Restrictions Precautions Precautions: Fall Restrictions Weight Bearing Restrictions: No      Mobility  Bed Mobility Overal bed mobility: Independent                  Transfers Overall transfer level: Needs assistance Equipment used: None Transfers: Sit to/from Stand Sit to Stand: Min guard           General transfer comment: Assist for  safety and a few seconds for initial stability    Ambulation/Gait Ambulation/Gait assistance: Min guard, Supervision Gait Distance (Feet): 300 Feet Assistive device: None, 1 person hand held assist Gait Pattern/deviations: Step-through pattern, Decreased stride length, Antalgic Gait velocity: fast Gait velocity interpretation: >4.37 ft/sec, indicative of normal walking speed   General Gait Details: Initial min guard with hand held assist progressing to supervision. Slight antalgic due to baseline knee pain. Pt reports he walks better with his shoes.  Stairs            Wheelchair Mobility    Modified Rankin (Stroke Patients Only) Modified Rankin (Stroke Patients Only) Pre-Morbid Rankin Score: No symptoms Modified Rankin: Moderately severe disability     Balance Overall balance assessment: Mild deficits observed, not formally tested                                           Pertinent Vitals/Pain Pain Assessment Pain Assessment: Faces Faces Pain Scale: Hurts little more Pain Location: bilateral knees lt>rt Pain Descriptors / Indicators: Grimacing, Guarding Pain Intervention(s): Limited activity within patient's tolerance    Home Living Family/patient expects to be discharged to:: Private residence Living Arrangements: Spouse/significant other Available Help at Discharge: Family;Available 24 hours/day Type of Home: Apartment Home Access: Elevator       Home Layout: One level Home Equipment: None      Prior Function Prior Level of Function :  Independent/Modified Independent;Driving                     Hand Dominance   Dominant Hand: Right    Extremity/Trunk Assessment   Upper Extremity Assessment Upper Extremity Assessment: Defer to OT evaluation    Lower Extremity Assessment Lower Extremity Assessment: LLE deficits/detail LLE Sensation: decreased light touch       Communication   Communication: No difficulties   Cognition Arousal/Alertness: Awake/alert Behavior During Therapy: Impulsive Overall Cognitive Status: Impaired/Different from baseline Area of Impairment: Attention                   Current Attention Level: Alternating           General Comments: Suspect some impulsiveness and decr attention may be baseline.        General Comments General comments (skin integrity, edema, etc.): VSS on RA    Exercises     Assessment/Plan    PT Assessment Patient needs continued PT services  PT Problem List Decreased balance;Decreased mobility;Impaired sensation       PT Treatment Interventions Gait training;Functional mobility training;Therapeutic activities;Therapeutic exercise;Balance training;Cognitive remediation;Patient/family education    PT Goals (Current goals can be found in the Care Plan section)  Acute Rehab PT Goals Patient Stated Goal: return to [place] on Tuesday PT Goal Formulation: With patient Time For Goal Achievement: 02/02/22 Potential to Achieve Goals: Good    Frequency Min 4X/week     Co-evaluation               AM-PAC PT "6 Clicks" Mobility  Outcome Measure Help needed turning from your back to your side while in a flat bed without using bedrails?: None Help needed moving from lying on your back to sitting on the side of a flat bed without using bedrails?: None Help needed moving to and from a bed to a chair (including a wheelchair)?: A Little Help needed standing up from a chair using your arms (e.g., wheelchair or bedside chair)?: A Little Help needed to walk in hospital room?: A Little Help needed climbing 3-5 steps with a railing? : A Little 6 Click Score: 20    End of Session Equipment Utilized During Treatment: Gait belt Activity Tolerance: Patient tolerated treatment well Patient left: in chair;with call bell/phone within reach;with chair alarm set;with family/visitor present Nurse Communication: Mobility status PT Visit  Diagnosis: Other abnormalities of gait and mobility (R26.89);Other symptoms and signs involving the nervous system (R29.898)    Time: 1005-1020 PT Time Calculation (min) (ACUTE ONLY): 15 min   Charges:   PT Evaluation $PT Eval Low Complexity: 1 Low          [facility] PT Acute Rehabilitation Services Office [phone]   Angelina Ok [facility] 01/26/2022, 10:54 AM

## 2022-01-27 ENCOUNTER — Other Ambulatory Visit (HOSPITAL_COMMUNITY): Payer: Self-pay

## 2022-01-27 DIAGNOSIS — I639 Cerebral infarction, unspecified: Secondary | ICD-10-CM

## 2022-01-27 DIAGNOSIS — Z8673 Personal history of transient ischemic attack (TIA), and cerebral infarction without residual deficits: Secondary | ICD-10-CM

## 2022-01-27 DIAGNOSIS — N400 Enlarged prostate without lower urinary tract symptoms: Secondary | ICD-10-CM | POA: Insufficient documentation

## 2022-01-27 LAB — HEMOGLOBIN A1C
Hgb A1c MFr Bld: 7.7 % — ABNORMAL HIGH (ref 4.8–5.6)
Mean Plasma Glucose: 174 mg/dL

## 2022-01-27 LAB — GLUCOSE, CAPILLARY
Glucose-Capillary: 179 mg/dL — ABNORMAL HIGH (ref 70–99)
Glucose-Capillary: 195 mg/dL — ABNORMAL HIGH (ref 70–99)
Glucose-Capillary: 215 mg/dL — ABNORMAL HIGH (ref 70–99)

## 2022-01-27 MED ORDER — ASPIRIN 81 MG PO CHEW
81.0000 mg | CHEWABLE_TABLET | Freq: Every day | ORAL | 0 refills | Status: AC
  Filled 2022-01-27: qty 30, 30d supply, fill #0

## 2022-01-27 MED ORDER — STROKE: EARLY STAGES OF RECOVERY BOOK
Status: AC
  Filled 2022-01-27: qty 1

## 2022-01-27 MED ORDER — CLOPIDOGREL BISULFATE 75 MG PO TABS
75.0000 mg | ORAL_TABLET | Freq: Every day | ORAL | 0 refills | Status: DC
  Filled 2022-01-27: qty 21, 21d supply, fill #0

## 2022-01-27 MED ORDER — ATORVASTATIN CALCIUM 40 MG PO TABS
40.0000 mg | ORAL_TABLET | Freq: Every day | ORAL | 0 refills | Status: AC
  Filled 2022-01-27: qty 30, 30d supply, fill #0

## 2022-01-27 MED ORDER — TAMSULOSIN HCL 0.4 MG PO CAPS
0.4000 mg | ORAL_CAPSULE | Freq: Every day | ORAL | Status: DC
  Administered 2022-01-27: 0.4 mg via ORAL
  Filled 2022-01-27: qty 1

## 2022-01-27 NOTE — TOC Transition Note (Signed)
Transition of Care [facility]) - CM/SW Discharge Note   Patient Details  Name: Gavin [name] MRN: [id number] Date of Birth: [birth year]  [facility]) CM/SW Contact:  Kermit Balo, RN Phone Number: 01/27/2022, 2:20 PM   Clinical Narrative:    Pt lives in [place] but is in [place] through December.  Pt is discharging home with outpatient therapy. Information on the AVS.  No DME needs.  Pt manages his own medications and drives self as needed.  Pts spouse providing transport home today.    Final next level of care: OP Rehab Barriers to Discharge: No Barriers Identified   Patient Goals and CMS Choice     Choice offered to / list presented to : Patient  Discharge Placement                       Discharge Plan and Services                                     Social Determinants of Health (SDOH) Interventions     Readmission Risk Interventions     No data to display

## 2022-01-27 NOTE — Progress Notes (Signed)
Pt transferred to 3W, RN and CNA at bedside, spouse at bedside also, pt transferred on tele and on Room air, no assessments changes noted.

## 2022-01-27 NOTE — Progress Notes (Signed)
Inpatient Diabetes Program Recommendations  AACE/ADA: New Consensus Statement on Inpatient Glycemic Control (2015)  Target Ranges:  Prepandial:   less than 140 mg/dL      Peak postprandial:   less than 180 mg/dL (1-2 hours)      Critically ill patients:  140 - 180 mg/dL   Lab Results  Component Value Date   GLUCAP 215 (H) 01/27/2022   HGBA1C 7.7 (H) 01/26/2022    Review of Glycemic Control  Latest Reference Range & Units 01/26/22 18:44 01/26/22 21:03 01/27/22 07:20 01/27/22 11:53  Glucose-Capillary 70 - 99 mg/dL 532 (H) 992 (H) 426 (H) 215 (H)   Diabetes history: DM 2 Outpatient Diabetes medications:  Amaryl 1 mg daily, Metformin 850 mg tid with meals, Novolog 16 units in the AM, Toujeo 30 units q HS Current orders for Inpatient glycemic control:  Novolog moderate tid with meals   Inpatient Diabetes Program Recommendations:    Please consider adding Semglee 15 units daily while in the hospital.   Thanks,  Beryl Meager, RN, BC-ADM Inpatient Diabetes Coordinator Pager [phone]  (8a-5p)

## 2022-01-27 NOTE — Evaluation (Signed)
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 Pt given TNK. PMH: DM, HLD, Shingles. MRI = Acute infarct in the right thalamus and punctate acute infarct in the high right frontal lobe cortex.   Assessment / Plan / Recommendation Clinical Impression  Pt seen at bedside to assess speech, language, and cognition. The SLUMS was administered. Pt scored 29/30, indicating cognition is WFL.  Pt's speech and language are at baseline. No further ST intervention recommended at this time. Please reconsult if needs arise.    SLP Assessment  SLP Recommendation/Assessment: Patient does not need any further Speech Language Pathology Services  SLP Visit Diagnosis: Cognitive communication deficit (R41.841)    Recommendations for follow up therapy are one component of a multi-disciplinary discharge planning process, led by the attending physician.  Recommendations may be updated based on patient status, additional functional criteria and insurance authorization.    Follow Up Recommendations  No SLP follow up    Assistance Recommended at Discharge  None  Functional Status Assessment Patient  has not had a recent decline in their functional status  Frequency and Duration  No further ST intervention recommended at this time.         SLP Evaluation Cognition  Overall Cognitive Status: Within Functional Limits for tasks assessed Arousal/Alertness: Awake/alert Orientation Level: Oriented X4       Comprehension  Auditory Comprehension Overall Auditory Comprehension: Appears within functional limits for tasks assessed    Expression Expression Primary Mode of Expression: Verbal Verbal Expression Overall Verbal Expression: Appears within functional limits for tasks assessed Written Expression Dominant Hand: Right   Oral / Motor  Oral Motor/Sensory Function Overall Oral Motor/Sensory Function: Within functional limits Motor Speech Overall Motor Speech: Appears within functional limits for tasks assessed Intelligibility: Intelligible            Leigh Aurora 01/27/2022, 10:54 AM

## 2022-01-27 NOTE — Discharge Summary (Cosign Needed)
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 Start taking on: January 28, 2022 Replaces: aspirin 81 MG tablet   atorvastatin 40 MG tablet Commonly known as: LIPITOR Take 1 tablet (40 mg total) by mouth daily. Start taking on: January 28, 2022 What changed:  medication strength how much to take   clopidogrel 75 MG tablet Commonly known as: PLAVIX Take 1 tablet (75 mg total) by mouth daily. Start taking on: January 28, 2022   finasteride 5 MG tablet Commonly known as: PROSCAR Take 5 mg by mouth daily.   glimepiride 1 MG tablet Commonly known as: AMARYL Take 1 mg by mouth daily.   losartan 50 MG tablet Commonly known as: COZAAR Take 50 mg by mouth daily.   metFORMIN 850 MG tablet Commonly known as: GLUCOPHAGE Take 1 tablet (850 mg total) by mouth 3 (three) times daily. Resume on Saturday 12/20/2011 secondary to contrast study performed on  12/17/2011. What changed:  how much to take when to take this additional instructions   NovoLOG FlexPen  100 UNIT/ML FlexPen Generic drug: insulin aspart Inject 16 Units into the skin in the morning.   omeprazole 20 MG capsule Commonly known as: PRILOSEC Take 20 mg by mouth daily.   sildenafil 50 MG tablet Commonly known as: VIAGRA Take 50 mg by mouth daily as needed.   tamsulosin 0.4 MG Caps capsule Commonly known as: FLOMAX Take 0.4 mg by mouth daily.   Toujeo SoloStar 300 UNIT/ML Solostar Pen Generic drug: insulin glargine (1 Unit Dial) Inject 30 Units into the skin at bedtime.        LABORATORY STUDIES CBC    Component Value Date/Time   WBC 9.8 01/25/2022 1354   RBC 5.30 01/25/2022 1354   HGB 13.9 01/25/2022 1405   HCT 41.0 01/25/2022 1405   PLT 185 01/25/2022 1354   MCV 82.5 01/25/2022 1354   MCH 26.6 01/25/2022 1354   MCHC 32.3 01/25/2022 1354   RDW 14.5 01/25/2022 1354   LYMPHSABS 2.4 01/25/2022 1354   MONOABS 0.8 01/25/2022 1354   EOSABS 0.5 01/25/2022 1354   BASOSABS 0.1 01/25/2022 1354   CMP    Component Value Date/Time   NA 139 01/25/2022 1405   K 4.3 01/25/2022 1405   CL 107 01/25/2022 1405   CO2 22 01/25/2022 1354   GLUCOSE 180 (H) 01/25/2022 1405   BUN 20 01/25/2022 1405   CREATININE 0.90 01/25/2022 1405   CREATININE 0.99 12/18/2014  1132   CALCIUM 9.9 01/25/2022 1354   PROT 6.5 01/25/2022 1354   ALBUMIN 4.1 01/25/2022 1354   AST 27 01/25/2022 1354   ALT 20 01/25/2022 1354   ALKPHOS 58 01/25/2022 1354   BILITOT 0.6 01/25/2022 1354   GFRNONAA >60 01/25/2022 1354   GFRAA >90 12/16/2011 1855   COAGS Lab Results  Component Value Date   INR 1.1 01/25/2022   Lipid Panel    Component Value Date/Time   CHOL 127 01/26/2022 0636   TRIG 42 01/26/2022 0636   HDL 41 01/26/2022 0636   CHOLHDL 3.1 01/26/2022 0636   VLDL 8 01/26/2022 0636   LDLCALC 78 01/26/2022 0636   HgbA1C  Lab Results  Component Value Date   HGBA1C 7.7 (H) 01/26/2022   Urinalysis    Component Value Date/Time   COLORURINE YELLOW 12/16/2011 1135   APPEARANCEUR CLEAR  12/16/2011 1135   LABSPEC 1.024 12/16/2011 1135   PHURINE 6.0 12/16/2011 1135   GLUCOSEU >1000 (A) 12/16/2011 1135   HGBUR NEGATIVE 12/16/2011 1135   BILIRUBINUR NEGATIVE 12/16/2011 1135   KETONESUR TRACE (A) 12/16/2011 1135   PROTEINUR NEGATIVE 12/16/2011 1135   UROBILINOGEN 0.2 12/16/2011 1135   NITRITE NEGATIVE 12/16/2011 1135   LEUKOCYTESUR NEGATIVE 12/16/2011 1135   Urine Drug Screen     Component Value Date/Time   LABOPIA NONE DETECTED 01/25/2022 1359   COCAINSCRNUR NONE DETECTED 01/25/2022 1359   LABBENZ NONE DETECTED 01/25/2022 1359   AMPHETMU NONE DETECTED 01/25/2022 1359   THCU NONE DETECTED 01/25/2022 1359   LABBARB NONE DETECTED 01/25/2022 1359    Alcohol Level    Component Value Date/Time   ETH <10 01/25/2022 1354     SIGNIFICANT DIAGNOSTIC STUDIES ECHOCARDIOGRAM COMPLETE  Result Date: 01/26/2022    ECHOCARDIOGRAM REPORT   Patient Name:   Gavin [name] Date of Exam: 01/26/2022 Medical Rec #:  [id number]       Height:       65.0 in Accession #:    [id number]      Weight:       168.4 lb Date of Birth:  [birth year]       BSA:          1.839 m Patient Age:    76 years        BP:           161/68 mmHg Patient Gender: M               HR:           74 bpm. Exam Location:  Inpatient Procedure: 2D Echo, Cardiac Doppler and Color Doppler Indications:    Stroke  History:        Patient has prior history of Echocardiogram examinations, most                 recent 01/01/2015. Risk Factors:Diabetes, Dyslipidemia,                 Hypertension and Former Smoker. GERD.  Sonographer:    Ross Ludwig RDCS (AE) Referring Phys: MCNEILL P [facility]  Sonographer Comments: Suboptimal parasternal window. IMPRESSIONS  1. Left ventricular ejection fraction, by estimation, is 50 to 55%. The left ventricle has low normal function. The left ventricle demonstrates regional wall motion abnormalities (see scoring diagram/findings for description). There is mild concentric left ventricular hypertrophy.  Left ventricular diastolic parameters are indeterminate.  2. Right ventricular systolic function is normal. The right ventricular size is normal. Tricuspid regurgitation signal is inadequate for assessing  PA pressure.  3. The mitral valve is grossly normal. Trivial mitral valve regurgitation.  4. The aortic valve was not well visualized. Aortic valve regurgitation is not visualized. Aortic valve mean gradient measures 2.0 mmHg.  5. The inferior vena cava is normal in size with greater than 50% respiratory variability, suggesting right atrial pressure of 3 mmHg. Comparison(s): Prior images unable to be directly viewed. FINDINGS  Left Ventricle: Left ventricular ejection fraction, by estimation, is 50 to 55%. The left ventricle has low normal function. The left ventricle demonstrates regional wall motion abnormalities. The left ventricular internal cavity size was normal in size. There is mild concentric left ventricular hypertrophy. Left ventricular diastolic parameters are indeterminate.  LV Wall Scoring: The inferior wall and basal inferoseptal segment are hypokinetic. The entire anterior wall, entire lateral wall, entire anterior septum, entire apex, and mid inferoseptal segment are normal. Right Ventricle: The right ventricular size is normal. No increase in right ventricular wall thickness. Right ventricular systolic function is normal. Tricuspid regurgitation signal is inadequate for assessing PA pressure. Left Atrium: Left atrial size was normal in size. Right Atrium: Right atrial size was normal in size. Pericardium: There is no evidence of pericardial effusion. Mitral Valve: The mitral valve is grossly normal. Trivial mitral valve regurgitation. Tricuspid Valve: The tricuspid valve is grossly normal. Tricuspid valve regurgitation is trivial. Aortic Valve: The aortic valve was not well visualized. Aortic valve regurgitation is not visualized. Aortic valve mean gradient measures 2.0 mmHg. Aortic valve peak  gradient measures 4.7 mmHg. Aortic valve area, by VTI measures 2.43 cm. Pulmonic Valve: The pulmonic valve was not well visualized. Pulmonic valve regurgitation is not visualized. Aorta: The aortic root is normal in size and structure. Venous: The inferior vena cava is normal in size with greater than 50% respiratory variability, suggesting right atrial pressure of 3 mmHg. IAS/Shunts: No atrial level shunt detected by color flow Doppler.  LEFT VENTRICLE PLAX 2D LVIDd:         4.40 cm     Diastology LVIDs:         3.10 cm     LV e' medial:    8.59 cm/s LV PW:         1.20 cm     LV E/e' medial:  7.7 LV IVS:        1.10 cm     LV e' lateral:   7.62 cm/s LVOT diam:     2.10 cm     LV E/e' lateral: 8.6 LV SV:         53 LV SV Index:   29 LVOT Area:     3.46 cm  LV Volumes (MOD) LV vol d, MOD A2C: 70.3 ml LV vol d, MOD A4C: 93.4 ml LV vol s, MOD A2C: 31.5 ml LV vol s, MOD A4C: 48.8 ml LV SV MOD A2C:     38.8 ml LV SV MOD A4C:     93.4 ml LV SV MOD BP:      43.3 ml RIGHT VENTRICLE             IVC RV Basal diam:  3.10 cm     IVC diam: 1.90 cm RV S prime:     13.10 cm/s TAPSE (M-mode): 1.9 cm LEFT ATRIUM           Index        RIGHT ATRIUM           Index LA diam:      3.70  cm 2.01 cm/m   RA Area:     12.00 cm LA Vol (A4C): 49.1 ml 26.70 ml/m  RA Volume:   24.10 ml  13.11 ml/m  AORTIC VALVE AV Area (Vmax):    2.69 cm AV Area (Vmean):   2.53 cm AV Area (VTI):     2.43 cm AV Vmax:           108.00 cm/s AV Vmean:          70.600 cm/s AV VTI:            0.218 m AV Peak Grad:      4.7 mmHg AV Mean Grad:      2.0 mmHg LVOT Vmax:         83.80 cm/s LVOT Vmean:        51.500 cm/s LVOT VTI:          0.153 m LVOT/AV VTI ratio: 0.70  AORTA Ao Root diam: 3.30 cm Ao Asc diam:  3.30 cm MITRAL VALVE MV Area (PHT): 2.37 cm    SHUNTS MV Decel Time: 320 msec    Systemic VTI:  0.15 m MV E velocity: 65.80 cm/s  Systemic Diam: 2.10 cm MV A velocity: 62.50 cm/s MV E/A ratio:  1.05 Nona Dell MD Electronically signed by Nona Dell MD Signature Date/Time: 01/26/2022/4:44:19 PM    Final    MR BRAIN WO CONTRAST  Result Date: 01/26/2022 CLINICAL DATA:  Stroke follow-up. EXAM: MRI HEAD WITHOUT CONTRAST TECHNIQUE: Multiplanar, multiecho pulse sequences of the brain and surrounding structures were obtained without intravenous contrast. COMPARISON:  CT/CTA head and neck 1 day prior FINDINGS: Brain: There is an acute infarct in the right thalamus without associated hemorrhage or mass effect. There is additional punctate infarct in the high right frontal lobe cortex (2-44). There is no acute intracranial hemorrhage, extra-axial fluid collection, or acute infarct. Parenchymal volume is stable. The ventricles are stable in size. Patchy FLAIR signal abnormality in the supratentorial white matter likely reflects sequela of underlying chronic small-vessel ischemic change. This has progressed since 2013. There is a single punctate chronic microhemorrhage in the right centrum semiovale, nonspecific. There is no mass lesion.  There is no mass effect or midline shift. Vascular: Normal flow voids. Skull and upper cervical spine: Normal marrow signal. Sinuses/Orbits: There is mild mucosal thickening in the paranasal sinuses. Bilateral lens implants are in place. The globes and orbits are otherwise unremarkable. Other: None. IMPRESSION: Acute infarct in the right thalamus and punctate acute infarct in the high right frontal lobe cortex. Electronically Signed   By: Lesia Hausen M.D.   On: 01/26/2022 15:28   CT HEAD WO CONTRAST  Result Date: 01/25/2022 CLINICAL DATA:  Stroke, follow-up EXAM: CT HEAD WITHOUT CONTRAST TECHNIQUE: Contiguous axial images were obtained from the base of the skull through the vertex without intravenous contrast. RADIATION DOSE REDUCTION: This exam was performed according to the departmental dose-optimization program which includes automated exposure control, adjustment of the mA and/or kV according to patient size and/or  use of iterative reconstruction technique. COMPARISON:  01/25/2022 2:06 p.m. FINDINGS: Brain: No evidence of acute infarction, hemorrhage, mass, mass effect, or midline shift. No hydrocephalus or extra-axial fluid collection. Vascular: No hyperdense vessel. Atherosclerotic calcifications in the intracranial carotid and vertebral arteries. Skull: Normal. Negative for fracture or focal lesion. Sinuses/Orbits: Mucosal thickening in the ethmoid air cells. Status post bilateral lens replacements. Other: The mastoid air cells are well aerated. IMPRESSION: No acute intracranial process. Electronically Signed  By: Wiliam Ke M.D.   On: 01/25/2022 21:02   CT ANGIO HEAD NECK W WO CM (CODE STROKE)  Result Date: 01/25/2022 CLINICAL DATA:  Code stroke EXAM: CT ANGIOGRAPHY HEAD AND NECK TECHNIQUE: Multidetector CT imaging of the head and neck was performed using the standard protocol during bolus administration of intravenous contrast. Multiplanar CT image reconstructions and MIPs were obtained to evaluate the vascular anatomy. Carotid stenosis measurements (when applicable) are obtained utilizing NASCET criteria, using the distal internal carotid diameter as the denominator. RADIATION DOSE REDUCTION: This exam was performed according to the departmental dose-optimization program which includes automated exposure control, adjustment of the mA and/or kV according to patient size and/or use of iterative reconstruction technique. CONTRAST:  25mL OMNIPAQUE IOHEXOL 350 MG/ML SOLN COMPARISON:  Same-day noncontrast head CT, CTA neck 12/17/2011, MRA head and neck 12/16/2011. FINDINGS: CTA NECK FINDINGS Aortic arch: There is mild calcified plaque in the aortic arch. The origins of the major branch vessels are patent. The subclavian arteries are patent to the level imaged. Right carotid system: The right common carotid artery is patent. There is mixed plaque in the proximal internal carotid artery without hemodynamically  significant stenosis relative to the distal vessel. The distal internal carotid artery is widely patent. The external carotid artery is patent. There is no dissection or aneurysm. Left carotid system: The left common, internal, and external carotid arteries are patent, with mild plaque at the bifurcation but no hemodynamically significant stenosis or occlusion. There is no dissection or aneurysm. Vertebral arteries: The vertebral arteries are patent, without hemodynamically significant stenosis or occlusion. There is no dissection or aneurysm. Skeleton: There is disc space narrowing and degenerative endplate change in the lower cervical spine. There is no acute osseous abnormality or suspicious osseous lesion. There is no visible canal hematoma. Other neck: The soft tissues of the neck are unremarkable. Upper chest: The imaged lung apices are clear. Review of the MIP images confirms the above findings CTA HEAD FINDINGS Anterior circulation: There is mild calcified plaque in the carotid siphons without significant stenosis or occlusion. The bilateral MCAs are patent, without proximal stenosis or occlusion. The bilateral ACAs are patent, without proximal stenosis or occlusion. The right A1 segment is hypoplastic/absent, a normal variant. There is no aneurysm or AVM. Posterior circulation: The bilateral V4 segments are patent. The basilar artery is patent. The major cerebellar arteries appear patent. The bilateral PCAs are patent, without proximal stenosis or occlusion. There is a fetal origin of the right PCA. A diminutive left posterior communicating artery is also identified. There is no aneurysm or AVM. Venous sinuses: Patent. Anatomic variants: As above. Review of the MIP images confirms the above findings IMPRESSION: 1. No emergent large vessel occlusion. 2. Atherosclerotic plaque at the carotid bifurcations, right more than left, without hemodynamically significant stenosis or occlusion. Findings communicated to  Dr. Amada Jupiter via Amion at 2:30 p.m. Electronically Signed   By: Lesia Hausen M.D.   On: 01/25/2022 14:32   CT HEAD CODE STROKE WO CONTRAST  Result Date: 01/25/2022 CLINICAL DATA:  Code stroke. EXAM: CT HEAD WITHOUT CONTRAST TECHNIQUE: Contiguous axial images were obtained from the base of the skull through the vertex without intravenous contrast. RADIATION DOSE REDUCTION: This exam was performed according to the departmental dose-optimization program which includes automated exposure control, adjustment of the mA and/or kV according to patient size and/or use of iterative reconstruction technique. COMPARISON:  Head CT 12/16/2011 FINDINGS: Brain: There is no acute intracranial hemorrhage, extra-axial fluid collection, or  acute infarct. Parenchymal volume is within expected limits for age. The ventricles are normal in size. Gray-white differentiation is preserved. There is mild background chronic small-vessel ischemic change. There is no mass lesion.  There is no mass effect or midline shift. Vascular: There is calcification of the bilateral carotid siphons. Skull: Normal. Negative for fracture or focal lesion. Sinuses/Orbits: There is mild mucosal thickening in the paranasal sinuses. Bilateral lens implants are in place. The globes and orbits are otherwise unremarkable. Other: None. ASPECTS [facility] Stroke Program Early CT Score) - Ganglionic level infarction (caudate, lentiform nuclei, internal capsule, insula, M1-M3 cortex): 7 - Supraganglionic infarction (M4-M6 cortex): 3 Total score (0-10 with 10 being normal): 10 IMPRESSION: 1. No acute intracranial pathology. 2. ASPECTS is 10 Findings communicated via Amion to Dr. Amada Jupiter at 2:11 p.m. Electronically Signed   By: Lesia Hausen M.D.   On: 01/25/2022 14:13      HISTORY OF PRESENT ILLNESS Mr. Samson Ralph is a 76 y.o. male with history of  Diabetes mellitus without complication (HCC), HLD (hyperlipidemia), and Shingles. who presents with  L face,  arm, and leg numbness. 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 No acute intracranial pathology. 2. ASPECTS is 10 CTA head & neck  1. No emergent large vessel occlusion. 2. Atherosclerotic plaque at the carotid bifurcations, right more than left, without hemodynamically significant stenosis or occlusion. MRI  Acute infarct in the right thalamus and punctate acute infarct in the high right frontal lobe cortex. 2D Echo EF 50-55% Given 2 separate stroke locations, recommend 30-day cardiac event monitoring with local PCP in [place] to rule out A-fib. LDL 78 HgbA1c: 7.7 VTE prophylaxis - Lovenox/ SCD's aspirin 81 mg daily prior to admission, now on aspirin 81 and Plavix 75 DAPT for 3 weeks and then Aspirin alone. Therapy recommendations:  none Disposition:  home today   Hypertension Home meds:  losartan 50mg  Stable Long-term BP goal normotensive   Hyperlipidemia Home meds:  lipitor 20mg  LDL 78, goal < 70 Increase Lipitor to 40 Continue statin at discharge   Diabetes type II Controlled Home meds:  glimepiride, metformin, insulin HgbA1c: 7.7, goal < 7.0 CBGs SSI Close PCP follow-up   DISCHARGE EXAM Blood pressure (!) 164/79, pulse 78, temperature 98.4 F (36.9 C), temperature source Oral, resp. rate 20, height 5\' 5"  (1.651 m), weight 76.4 kg, SpO2 98 %. On exam, patient Aox4, no aphasia/dysarthria, follows commands and moves all extremities. LUE has 4/5 strength with continued numbness and tingling in left hand. Patient also reports numbness and tingling on his left face, but states it is "a little better than before". Wife at bedside.   Discharge Diet       Diet   Diet Carb Modified Fluid consistency: Thin; Room service appropriate? Yes   liquids  DISCHARGE PLAN Disposition:  Home aspirin 81 mg daily and clopidogrel 75 mg daily for secondary  stroke prevention for 3 weeks, then aspirin alone. Ongoing stroke risk factor control by Primary Care Physician at time of discharge Follow-up PCP Koirala, Dibas, MD in 2 weeks. Follow-up with Neurology in 8 weeks.  Recommendations: 30-day Holter Monitor d/t concern for embolic stroke.   45 minutes were spent preparing discharge.  , DNP, AGACNP-BC [facility] Pager: [phone]  ATTENDING NOTE: I reviewed above note and agree with the assessment and plan. Pt was seen  and examined.   Pt doing well, neuro improved. Pt/OT no recommendations. Will be discharged with DAPT and statin. Recommend on air travel within 2 weeks of stroke. Follow up with local PCP and neurologist and consider 30 day monitoring. Return to ED if any new stroke like symptoms. Pt expressed understanding  For detailed assessment and plan, please refer to above/below as I have made changes wherever appropriate.   Marvel PlanJindong Xu, MD PhD Stroke Neurology 01/28/2022 11:07 PM

## 2022-01-27 NOTE — Progress Notes (Signed)
Patient ready for discharge to home; discharge instructions given and reviewed: Rxs filled by [facility] pharmacy; patient discharged out via wheelchair by volunteer services.and to pick up medications on the way out.

## 2022-01-27 NOTE — Progress Notes (Signed)
Physical Therapy Treatment/ Discharge Patient Details Name: Gavin [name] MRN: [id number] DOB: 09-Dec-1945 Today's Date: 01/27/2022   History of Present Illness Pt is 76 yo M admitted 01/25/22 with lt face and LUE numbness and developed worsening gait in ED. CT negative. s/p TNK. 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 PMH - DM, shingles    PT Comments    Pt very pleasant and eager to go home. Pt with minor balance deficits with slight right veer with gait and partial LOB when getting shoe off the floor. Pt aware of balance deficits and that OPPT at home would be beneficial to maximize gait and safety. No further acute therapy needs. Will sign off and recommend daily ambulation with staff.    Recommendations for follow up therapy are one component of a multi-disciplinary discharge planning process, led by the attending physician.  Recommendations may be updated based on patient status, additional functional criteria and insurance authorization.  Follow Up Recommendations  Outpatient PT     Assistance Recommended at Discharge Intermittent Supervision/Assistance  Patient can return home with the following Assistance with cooking/housework   Equipment Recommendations  None recommended by PT    Recommendations for Other Services       Precautions / Restrictions Precautions Precautions: Fall Precaution Comments: likes to wear shoes     Mobility  Bed Mobility               General bed mobility comments: EOB on arrival    Transfers Overall transfer level: Independent   Transfers: Bed to chair/wheelchair/BSC, Sit to/from Stand Sit to Stand: Independent Stand pivot transfers: Independent              Ambulation/Gait Ambulation/Gait assistance: Min guard Gait Distance (Feet): 450 Feet Assistive device: None Gait Pattern/deviations: Step-through pattern, Decreased stride length   Gait velocity  interpretation: >4.37 ft/sec, indicative of normal walking speed   General Gait Details: pt with stable gait, slight veer to right x 2 with guarding but did not require physical assist to correct balance. Walked with shoes. Unable to increase speed, slight deficits with vertical head turns   Social research officer, government Rankin (Stroke Patients Only) Modified Rankin (Stroke Patients Only) Pre-Morbid Rankin Score: No symptoms Modified Rankin: Moderate disability     Balance Overall balance assessment: Needs assistance                               Standardized Balance Assessment Standardized Balance Assessment : Berg Balance Test Berg Balance Test Sit to Stand: Able to stand without using hands and stabilize independently Standing Unsupported: Able to stand safely 2 minutes Sitting with Back Unsupported but Feet Supported on Floor or Stool: Able to sit safely and securely 2 minutes Stand to Sit: Sits safely with minimal use of hands Transfers: Able to transfer safely, minor use of hands Standing Unsupported with Eyes Closed: Able to stand 10 seconds safely Standing Ubsupported with Feet Together: Able to place feet together independently and stand 1 minute safely From Standing, Reach Forward with Outstretched Arm: Can reach forward >12 cm safely (5") From Standing Position, Pick up Object from Floor: Able to pick up shoe, needs supervision From Standing Position, Turn to Look Behind Over each Shoulder: Looks behind from both sides and weight shifts well Turn 360  Degrees: Able to turn 360 degrees safely in 4 seconds or less Standing Unsupported, Alternately Place Feet on Step/Stool: Able to stand independently and complete 8 steps >20 seconds Standing Unsupported, One Foot in Front: Able to place foot tandem independently and hold 30 seconds Standing on One Leg: Tries to lift leg/unable to hold 3 seconds but remains standing  independently Total Score: 50        Cognition Arousal/Alertness: Awake/alert Behavior During Therapy: Impulsive Overall Cognitive Status: Within Functional Limits for tasks assessed                                          Exercises      General Comments        Pertinent Vitals/Pain Pain Assessment Pain Assessment: No/denies pain    Home Living                          Prior Function            PT Goals (current goals can now be found in the care plan section) Progress towards PT goals: Progressing toward goals    Frequency           PT Plan Discharge plan needs to be updated    Co-evaluation              AM-PAC PT "6 Clicks" Mobility   Outcome Measure  Help needed turning from your back to your side while in a flat bed without using bedrails?: None Help needed moving from lying on your back to sitting on the side of a flat bed without using bedrails?: None Help needed moving to and from a bed to a chair (including a wheelchair)?: None Help needed standing up from a chair using your arms (e.g., wheelchair or bedside chair)?: None Help needed to walk in hospital room?: A Little Help needed climbing 3-5 steps with a railing? : A Little 6 Click Score: 22    End of Session   Activity Tolerance: Patient tolerated treatment well Patient left: in chair;with call bell/phone within reach;with chair alarm set Nurse Communication: Mobility status PT Visit Diagnosis: Other abnormalities of gait and mobility (R26.89);Other symptoms and signs involving the nervous system (D63.875)     Time: 6433-2951 PT Time Calculation (min) (ACUTE ONLY): 16 min  Charges:  $Physical Performance Test: 8-22 mins                     Merryl Hacker, PT Acute Rehabilitation Services Office: [phone]    Johnney Scarlata B Delila Kuklinski 01/27/2022, 8:00 AM

## 2022-01-27 NOTE — Progress Notes (Signed)
Awaiting for receiving RN on 3W to call back to get report, unavailable at the moment.

## 2022-01-28 ENCOUNTER — Observation Stay (HOSPITAL_COMMUNITY): Payer: Medicare (Managed Care)

## 2022-01-28 ENCOUNTER — Emergency Department (HOSPITAL_COMMUNITY): Payer: Medicare (Managed Care)

## 2022-01-28 ENCOUNTER — Encounter (HOSPITAL_COMMUNITY): Payer: Self-pay

## 2022-01-28 ENCOUNTER — Inpatient Hospital Stay (HOSPITAL_COMMUNITY)
Admission: EM | Admit: 2022-01-28 | Discharge: 2022-02-01 | DRG: 035 | Disposition: A | Discharge status: Home / Self Care | Payer: Medicare (Managed Care) | Attending: Family Medicine | Admitting: Family Medicine

## 2022-01-28 DIAGNOSIS — Z1152 Encounter for screening for COVID-19: Secondary | ICD-10-CM

## 2022-01-28 DIAGNOSIS — E785 Hyperlipidemia, unspecified: Secondary | ICD-10-CM | POA: Diagnosis present

## 2022-01-28 DIAGNOSIS — K219 Gastro-esophageal reflux disease without esophagitis: Secondary | ICD-10-CM | POA: Diagnosis not present

## 2022-01-28 DIAGNOSIS — Z794 Long term (current) use of insulin: Secondary | ICD-10-CM

## 2022-01-28 DIAGNOSIS — Z79899 Other long term (current) drug therapy: Secondary | ICD-10-CM

## 2022-01-28 DIAGNOSIS — R29818 Other symptoms and signs involving the nervous system: Secondary | ICD-10-CM

## 2022-01-28 DIAGNOSIS — E1165 Type 2 diabetes mellitus with hyperglycemia: Secondary | ICD-10-CM | POA: Diagnosis not present

## 2022-01-28 DIAGNOSIS — I10 Essential (primary) hypertension: Secondary | ICD-10-CM | POA: Diagnosis present

## 2022-01-28 DIAGNOSIS — Z7902 Long term (current) use of antithrombotics/antiplatelets: Secondary | ICD-10-CM

## 2022-01-28 DIAGNOSIS — G8194 Hemiplegia, unspecified affecting left nondominant side: Secondary | ICD-10-CM | POA: Diagnosis present

## 2022-01-28 DIAGNOSIS — R490 Dysphonia: Secondary | ICD-10-CM | POA: Diagnosis present

## 2022-01-28 DIAGNOSIS — N400 Enlarged prostate without lower urinary tract symptoms: Secondary | ICD-10-CM | POA: Diagnosis present

## 2022-01-28 DIAGNOSIS — I639 Cerebral infarction, unspecified: Secondary | ICD-10-CM

## 2022-01-28 DIAGNOSIS — R569 Unspecified convulsions: Secondary | ICD-10-CM

## 2022-01-28 DIAGNOSIS — R2990 Unspecified symptoms and signs involving the nervous system: Secondary | ICD-10-CM | POA: Diagnosis not present

## 2022-01-28 DIAGNOSIS — I6521 Occlusion and stenosis of right carotid artery: Secondary | ICD-10-CM

## 2022-01-28 DIAGNOSIS — E119 Type 2 diabetes mellitus without complications: Secondary | ICD-10-CM

## 2022-01-28 DIAGNOSIS — Z8673 Personal history of transient ischemic attack (TIA), and cerebral infarction without residual deficits: Secondary | ICD-10-CM

## 2022-01-28 DIAGNOSIS — I63331 Cerebral infarction due to thrombosis of right posterior cerebral artery: Principal | ICD-10-CM

## 2022-01-28 DIAGNOSIS — I63511 Cerebral infarction due to unspecified occlusion or stenosis of right middle cerebral artery: Principal | ICD-10-CM | POA: Diagnosis present

## 2022-01-28 DIAGNOSIS — R29810 Facial weakness: Secondary | ICD-10-CM | POA: Diagnosis present

## 2022-01-28 DIAGNOSIS — E875 Hyperkalemia: Secondary | ICD-10-CM | POA: Diagnosis present

## 2022-01-28 DIAGNOSIS — Z87891 Personal history of nicotine dependence: Secondary | ICD-10-CM

## 2022-01-28 DIAGNOSIS — Z7982 Long term (current) use of aspirin: Secondary | ICD-10-CM

## 2022-01-28 DIAGNOSIS — Z833 Family history of diabetes mellitus: Secondary | ICD-10-CM

## 2022-01-28 DIAGNOSIS — Z7984 Long term (current) use of oral hypoglycemic drugs: Secondary | ICD-10-CM

## 2022-01-28 DIAGNOSIS — R29702 NIHSS score 2: Secondary | ICD-10-CM | POA: Diagnosis present

## 2022-01-28 DIAGNOSIS — Z888 Allergy status to other drugs, medicaments and biological substances status: Secondary | ICD-10-CM

## 2022-01-28 LAB — CBC
HCT: 44.3 % (ref 39.0–52.0)
Hemoglobin: 14.4 g/dL (ref 13.0–17.0)
MCH: 26.4 pg (ref 26.0–34.0)
MCHC: 32.5 g/dL (ref 30.0–36.0)
MCV: 81.1 fL (ref 80.0–100.0)
Platelets: 185 10*3/uL (ref 150–400)
RBC: 5.46 MIL/uL (ref 4.22–5.81)
RDW: 14.7 % (ref 11.5–15.5)
WBC: 10 10*3/uL (ref 4.0–10.5)
nRBC: 0 % (ref 0.0–0.2)

## 2022-01-28 LAB — I-STAT CHEM 8, ED
BUN: 24 mg/dL — ABNORMAL HIGH (ref 8–23)
Calcium, Ion: 1.13 mmol/L — ABNORMAL LOW (ref 1.15–1.40)
Chloride: 102 mmol/L (ref 98–111)
Creatinine, Ser: 1 mg/dL (ref 0.61–1.24)
Glucose, Bld: 257 mg/dL — ABNORMAL HIGH (ref 70–99)
HCT: 44 % (ref 39.0–52.0)
Hemoglobin: 15 g/dL (ref 13.0–17.0)
Potassium: 6.4 mmol/L (ref 3.5–5.1)
Sodium: 135 mmol/L (ref 135–145)
TCO2: 25 mmol/L (ref 22–32)

## 2022-01-28 LAB — PROTIME-INR
INR: 1 (ref 0.8–1.2)
Prothrombin Time: 13.4 seconds (ref 11.4–15.2)

## 2022-01-28 LAB — URINALYSIS, ROUTINE W REFLEX MICROSCOPIC
Bacteria, UA: NONE SEEN
Bilirubin Urine: NEGATIVE
Glucose, UA: >=500 mg/dL — AB
Hgb urine dipstick: NEGATIVE
Ketones, ur: NEGATIVE mg/dL
Leukocytes,Ua: NEGATIVE
Nitrite: NEGATIVE
Protein, ur: 30 mg/dL — AB
Specific Gravity, Urine: 1.015 (ref 1.005–1.030)
pH: 5 (ref 5.0–8.0)

## 2022-01-28 LAB — COMPREHENSIVE METABOLIC PANEL
ALT: 16 U/L (ref 0–44)
AST: 18 U/L (ref 15–41)
Albumin: 3.8 g/dL (ref 3.5–5.0)
Alkaline Phosphatase: 53 U/L (ref 38–126)
Anion gap: 10 (ref 5–15)
BUN: 15 mg/dL (ref 8–23)
CO2: 25 mmol/L (ref 22–32)
Calcium: 10 mg/dL (ref 8.9–10.3)
Chloride: 105 mmol/L (ref 98–111)
Creatinine, Ser: 1.08 mg/dL (ref 0.61–1.24)
GFR, Estimated: >60 mL/min (ref >60)
Glucose, Bld: 178 mg/dL — ABNORMAL HIGH (ref 70–99)
Potassium: 3.9 mmol/L (ref 3.5–5.1)
Sodium: 140 mmol/L (ref 135–145)
Total Bilirubin: 0.6 mg/dL (ref 0.3–1.2)
Total Protein: 6.5 g/dL (ref 6.5–8.1)

## 2022-01-28 LAB — DIFFERENTIAL
Abs Immature Granulocytes: 0.03 10*3/uL (ref 0.00–0.07)
Basophils Absolute: 0.1 10*3/uL (ref 0.0–0.1)
Basophils Relative: 1 %
Eosinophils Absolute: 0.2 10*3/uL (ref 0.0–0.5)
Eosinophils Relative: 2 %
Immature Granulocytes: 0 %
Lymphocytes Relative: 21 %
Lymphs Abs: 2.1 10*3/uL (ref 0.7–4.0)
Monocytes Absolute: 0.8 10*3/uL (ref 0.1–1.0)
Monocytes Relative: 8 %
Neutro Abs: 6.8 10*3/uL (ref 1.7–7.7)
Neutrophils Relative %: 68 %

## 2022-01-28 LAB — CBG MONITORING, ED
Glucose-Capillary: 242 mg/dL — ABNORMAL HIGH (ref 70–99)
Glucose-Capillary: 164 mg/dL — ABNORMAL HIGH (ref 70–99)
Glucose-Capillary: 185 mg/dL — ABNORMAL HIGH (ref 70–99)

## 2022-01-28 LAB — APTT: aPTT: 26 seconds (ref 24–36)

## 2022-01-28 LAB — ETHANOL: Alcohol, Ethyl (B): <10 mg/dL (ref <10)

## 2022-01-28 MED ORDER — INSULIN ASPART 100 UNIT/ML IJ SOLN
0.0000 [IU] | Freq: Three times a day (TID) | INTRAMUSCULAR | Status: DC
  Administered 2022-01-28 – 2022-01-29 (×2): 3 [IU] via SUBCUTANEOUS
  Administered 2022-01-29: 8 [IU] via SUBCUTANEOUS
  Administered 2022-01-29: 3 [IU] via SUBCUTANEOUS
  Administered 2022-01-30: 5 [IU] via SUBCUTANEOUS
  Administered 2022-01-30: 3 [IU] via SUBCUTANEOUS
  Administered 2022-01-30: 8 [IU] via SUBCUTANEOUS
  Administered 2022-01-31 (×2): 2 [IU] via SUBCUTANEOUS
  Administered 2022-02-01: 3 [IU] via SUBCUTANEOUS
  Administered 2022-02-01: 5 [IU] via SUBCUTANEOUS

## 2022-01-28 MED ORDER — SENNOSIDES-DOCUSATE SODIUM 8.6-50 MG PO TABS
1.0000 | ORAL_TABLET | Freq: Every evening | ORAL | Status: DC | PRN
  Administered 2022-01-29 – 2022-01-30 (×2): 1 via ORAL
  Filled 2022-01-28 (×2): qty 1

## 2022-01-28 MED ORDER — INSULIN GLARGINE-YFGN 100 UNIT/ML ~~LOC~~ SOLN
20.0000 [IU] | Freq: Every day | SUBCUTANEOUS | Status: DC
  Administered 2022-01-28 – 2022-01-31 (×4): 20 [IU] via SUBCUTANEOUS
  Filled 2022-01-28 (×8): qty 0.2

## 2022-01-28 MED ORDER — ENOXAPARIN SODIUM 40 MG/0.4ML IJ SOSY
40.0000 mg | PREFILLED_SYRINGE | INTRAMUSCULAR | Status: DC
  Filled 2022-01-28: qty 0.4

## 2022-01-28 MED ORDER — ACETAMINOPHEN 160 MG/5ML PO SOLN: 650.0000 mg | ORAL | Status: DC | PRN

## 2022-01-28 MED ORDER — CLOPIDOGREL BISULFATE 75 MG PO TABS
75.0000 mg | ORAL_TABLET | Freq: Every day | ORAL | Status: DC
  Administered 2022-01-29 – 2022-02-01 (×4): 75 mg via ORAL
  Filled 2022-01-28 (×4): qty 1

## 2022-01-28 MED ORDER — TAMSULOSIN HCL 0.4 MG PO CAPS
0.4000 mg | ORAL_CAPSULE | Freq: Every day | ORAL | Status: DC
  Administered 2022-01-29 – 2022-02-01 (×4): 0.4 mg via ORAL
  Filled 2022-01-28 (×4): qty 1

## 2022-01-28 MED ORDER — ATORVASTATIN CALCIUM 40 MG PO TABS
40.0000 mg | ORAL_TABLET | Freq: Every day | ORAL | Status: DC
  Administered 2022-01-29 – 2022-02-01 (×4): 40 mg via ORAL
  Filled 2022-01-28 (×4): qty 1

## 2022-01-28 MED ORDER — STROKE: EARLY STAGES OF RECOVERY BOOK
Freq: Once | Status: AC
  Filled 2022-01-28: qty 1

## 2022-01-28 MED ORDER — FINASTERIDE 5 MG PO TABS
5.0000 mg | ORAL_TABLET | Freq: Every day | ORAL | Status: DC
  Administered 2022-01-29 – 2022-02-01 (×4): 5 mg via ORAL
  Filled 2022-01-28 (×4): qty 1

## 2022-01-28 MED ORDER — ACETAMINOPHEN 650 MG RE SUPP: 650.0000 mg | RECTAL | Status: DC | PRN

## 2022-01-28 MED ORDER — ACETAMINOPHEN 325 MG PO TABS
650.0000 mg | ORAL_TABLET | ORAL | Status: DC | PRN
  Administered 2022-01-31: 650 mg via ORAL
  Filled 2022-01-28: qty 2

## 2022-01-28 MED ORDER — ASPIRIN 81 MG PO CHEW
81.0000 mg | CHEWABLE_TABLET | Freq: Every day | ORAL | Status: DC
  Administered 2022-01-29 – 2022-02-01 (×4): 81 mg via ORAL
  Filled 2022-01-28 (×4): qty 1

## 2022-01-28 MED ORDER — INSULIN ASPART 100 UNIT/ML IJ SOLN
0.0000 [IU] | Freq: Every day | INTRAMUSCULAR | Status: DC
  Administered 2022-01-29: 2 [IU] via SUBCUTANEOUS

## 2022-01-28 MED ORDER — PANTOPRAZOLE SODIUM 40 MG PO TBEC
40.0000 mg | DELAYED_RELEASE_TABLET | Freq: Every day | ORAL | Status: DC
  Administered 2022-01-29 – 2022-02-01 (×4): 40 mg via ORAL
  Filled 2022-01-28 (×4): qty 1

## 2022-01-28 NOTE — Consult Note (Signed)
Stroke Neurology Consultation Note  Consult Requested by: Dr. Adela Lank  Reason for Consult: code stroke  Consult Date: 01/28/22   The history was obtained from the pt.  During history and examination, all items were able to obtain unless otherwise noted.  History of Present Illness:  Gavin [name] is a 77 y.o. Caucasian male with PMH of hypertension, hyperlipidemia, diabetes, shingles initially admitted on 01/25/22 for left-sided numbness and weakness.  CT no acute abnormality.  Status post TNK.  CTA head and neck unremarkable.  CT repeat no acute abnormality.  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  EF 50 to 55%, LDL 78, A1c 7.7, UDS negative. His symptoms improved and discharged on 11/27 with DAPT and lipitor 40.   Pt went home yesterday afternoon with mild left facial droop and facial numbness and left hand weakness but resolved left UE or LE numbness. This morning about 9am he started to have left UE and LE numbness, imbalance on walking, symptoms getting worse, EMS called and code stroke activated.   LSN: 9am tPA Given: No: recent stroke and mild nondisabling symptoms.  IR: no, no signs of LVO  Past Medical History:  Diagnosis Date   Diabetes mellitus without complication (HCC)    HLD (hyperlipidemia)    Shingles    TIA (transient ischemic attack) 12/16/2011    Past Surgical History:  Procedure Laterality Date   NO PAST SURGERIES      Family History  Problem Relation Age of Onset   Broken bones Mother    Diabetes Father     Social History:  reports that he has quit smoking. His smoking use included cigarettes. He has never used smokeless tobacco. He reports [name] alcohol use. He reports that he does not use drugs.  Allergies:  Allergies  Allergen Reactions   Altace [Ramipril] Anaphylaxis   Cortizone-10 [Hydrocortisone] Other (See Comments)    Vision problems    No [name] facility-administered medications on file prior to encounter.    [name] Out[facility] Medications on File Prior to Encounter  Medication Sig Dispense Refill   aspirin 81 MG chewable tablet Chew 1 tablet (81 mg total) by mouth daily. 30 tablet 0   atorvastatin (LIPITOR) 40 MG tablet Take 1 tablet (40 mg total) by mouth daily. 30 tablet 0   clopidogrel (PLAVIX) 75 MG tablet Take 1 tablet (75 mg total) by mouth daily. 21 tablet 0   finasteride (PROSCAR) 5 MG tablet Take 5 mg by mouth daily.      glimepiride (AMARYL) 1 MG tablet Take 1 mg by mouth daily.     losartan (COZAAR) 50 MG tablet Take 50 mg by mouth daily.     metFORMIN (GLUCOPHAGE) 850 MG tablet Take 1 tablet (850 mg total) by mouth 3 (three) times daily. Resume on Saturday 12/20/2011 secondary to contrast study performed on  12/17/2011. (Patient taking differently: Take 1,000 mg by mouth 2 (two) times daily with a meal.)     NOVOLOG FLEXPEN 100 UNIT/ML FlexPen Inject 12-16 Units into the skin in the morning.     omeprazole (PRILOSEC) 20 MG capsule Take 20 mg by mouth daily.     sildenafil (VIAGRA) 50 MG tablet Take 50 mg by mouth daily as needed.     tamsulosin (FLOMAX) 0.4 MG CAPS capsule Take 0.4 mg by mouth daily.     TOUJEO SOLOSTAR 300 UNIT/ML SOPN Inject 30 Units into the skin at bedtime.      Review of Systems: A full ROS  was attempted today and was able to be performed.  Systems assessed include - Constitutional, Eyes, HENT, Respiratory, Cardiovascular, Gastrointestinal, Genitourinary, Integument/breast, Hematologic/lymphatic, Musculoskeletal, Neurological, Behavioral/Psych, Endocrine, Allergic/Immunologic - with pertinent responses as per HPI.  Physical Examination: Pulse Rate:  [96] 96 (11/28 1231) BP: (152)/(72) 152/72 (11/28 1231)  General - well nourished, well developed, in no apparent distress.    Ophthalmologic - fundi not visualized due to noncooperation.    Cardiovascular - regular rhythm and rate  Mental Status -  Level of arousal and orientation to time, place, and person  were intact. Language including expression, naming, repetition, comprehension was assessed and found intact. Fund of Knowledge was assessed and was intact.  Cranial Nerves II - XII - II - Vision intact OU. III, IV, VI - Extraocular movements intact. V - decreased left facial light touch VII - mild left facial droop. VIII - Hearing & vestibular intact bilaterally. X - Palate elevates symmetrically. XI - Chin turning & shoulder shrug intact bilaterally. XII - Tongue protrusion intact.  Motor Strength - The patient's strength was normal in all extremities and pronator drift was absent but slight decreased left hand dexterity.   Motor Tone & Bulk - Muscle tone was assessed at the neck and appendages and was normal.  Bulk was normal and fasciculations were absent.   Reflexes - The patient's reflexes were normal in all extremities and he had no pathological reflexes.  Sensory - Light touch, temperature/pinprick were assessed and were decreased on the LUE and LLE.    Coordination - The patient had normal movements in the right hand and feet with no ataxia or dysmetria. Slight dysmetria on the left FTN. Tremor was absent.  Gait and Station - deferred  NIH Stroke Scale  Level Of Consciousness 0=Alert; keenly responsive 1=Arouse to minor stimulation 2=Requires repeated stimulation to arouse or movements to pain 3=postures or unresponsive 0  LOC Questions to Month and Age 69=Answers both questions correctly 1=Answers one question correctly or dysarthria/intubated/trauma/language barrier 2=Answers neither question correctly or aphasia 0  LOC Commands      -Open/Close eyes     -Open/close grip     -Pantomime commands if communication barrier 0=Performs both tasks correctly 1=Performs one task correctly 2=Performs neighter task correctly 0  Best Gaze     -Only assess horizontal gaze 0=Normal 1=Partial gaze palsy 2=Forced deviation, or total gaze paresis 0  Visual 0=No visual  loss 1=Partial hemianopia 2=Complete hemianopia 3=Bilateral hemianopia (blind including cortical blindness) 0  Facial Palsy     -Use grimace if obtunded 0=Normal symmetrical movement 1=Minor paralysis (asymmetry) 2=Partial paralysis (lower face) 3=Complete paralysis (upper and lower face) 1  Motor  0=No drift for 10/5 seconds 1=Drift, but does not hit bed 2=Some antigravity effort, hits  bed 3=No effort against gravity, limb falls 4=No movement 0=Amputation/joint fusion Right Arm 0     Leg 0    Left Arm 0     Leg 0  Limb Ataxia     - FNT/HTS 0=Absent or does not understand or paralyzed or amputation/joint fusion 1=Present in one limb 2=Present in two limbs 1  Sensory 0=Normal 1=Mild to moderate sensory loss 2=Severe to total sensory loss or coma/unresponsive 1  Best Language 0=No aphasia, normal 1=Mild to moderate aphasia 2=Severe aphasia 3=Mute, global aphasia, or coma/unresponsive 0  Dysarthria 0=Normal 1=Mild to moderate 2=Severe, unintelligible or mute/anarthric 0=intubated/unable to test 0  Extinction/Neglect 0=No abnormality 1=visual/tactile/auditory/spatia/personal inattention/Extinction to bilateral simultaneous stimulation 2=Profound neglect/extinction more than 1 modality  0  Total   3     Data Reviewed: CT HEAD CODE STROKE WO CONTRAST  Result Date: 01/28/2022 CLINICAL DATA:  Code stroke. Neuro deficit, acute, stroke suspected. EXAM: CT HEAD WITHOUT CONTRAST TECHNIQUE: Contiguous axial images were obtained from the base of the skull through the vertex without intravenous contrast. RADIATION DOSE REDUCTION: This exam was performed according to the departmental dose-optimization program which includes automated exposure control, adjustment of the mA and/or kV according to patient size and/or use of iterative reconstruction technique. COMPARISON:  MRI 2 days ago.  Head CT 3 days ago. FINDINGS: Brain: The brainstem and cerebellum appear normal. Focal low-density in  the posterolateral right thalamus corresponds with the acute infarction shown by recent MRI. No evidence of progression or hemorrhage. Otherwise, cerebral hemispheres show age related volume loss without other acute insult. Small right posterior frontal cortical infarction shown by MRI is not visible. No mass, hemorrhage, hydrocephalus or extra-axial collection. Vascular: Major vessels at the base of the brain show flow. Skull: Negative Sinuses/Orbits: Mild mucosal thickening.  Orbits normal. Other: None ASPECTS ([place] Stroke Program Early CT Score) - Ganglionic level infarction (caudate, lentiform nuclei, internal capsule, insula, M1-M3 cortex): 7 - Supraganglionic infarction (M4-M6 cortex): 3 Total score (0-10 with 10 being normal): 10 IMPRESSION: 1. Focal low-density in the posterolateral right thalamus corresponds with the acute infarction shown by recent MRI. No evidence of progression or hemorrhage. 2. Small right posterior frontal cortical infarction shown by MRI is not visible. No other finding. 3. Aspects is 10. These results were communicated to Dr. Roda Shutters at 12:27 pm on 01/28/2022 by text page via the [facility] messaging system. Electronically Signed   By: Paulina Fusi M.D.   On: 01/28/2022 12:28   ECHOCARDIOGRAM COMPLETE  Result Date: 01/26/2022    ECHOCARDIOGRAM REPORT   Patient Name:   Gavin [name] Date of Exam: 01/26/2022 Medical Rec #:  [id number]       Height:       65.0 in Accession #:    [id number]      Weight:       168.4 lb Date of Birth:  19-Nov-1945       BSA:          1.839 m Patient Age:    76 years        BP:           161/68 mmHg Patient Gender: M               HR:           74 bpm. Exam Location:  Inpatient Procedure: 2D Echo, Cardiac Doppler and Color Doppler Indications:    Stroke  History:        Patient has prior history of Echocardiogram examinations, most                 recent 01/01/2015. Risk Factors:Diabetes, Dyslipidemia,                 Hypertension and Former Smoker. GERD.   Sonographer:    Ross Ludwig RDCS (AE) Referring Phys: MCNEILL P [facility]  Sonographer Comments: Suboptimal parasternal window. IMPRESSIONS  1. Left ventricular ejection fraction, by estimation, is 50 to 55%. The left ventricle has low normal function. The left ventricle demonstrates regional wall motion abnormalities (see scoring diagram/findings for description). There is mild concentric left ventricular hypertrophy. Left ventricular diastolic parameters are indeterminate.  2. Right ventricular systolic function is normal. The right ventricular size is normal. Tricuspid regurgitation signal  is inadequate for assessing PA pressure.  3. The mitral valve is grossly normal. Trivial mitral valve regurgitation.  4. The aortic valve was not well visualized. Aortic valve regurgitation is not visualized. Aortic valve mean gradient measures 2.0 mmHg.  5. The inferior vena cava is normal in size with greater than 50% respiratory variability, suggesting right atrial pressure of 3 mmHg. Comparison(s): Prior images unable to be directly viewed. FINDINGS  Left Ventricle: Left ventricular ejection fraction, by estimation, is 50 to 55%. The left ventricle has low normal function. The left ventricle demonstrates regional wall motion abnormalities. The left ventricular internal cavity size was normal in size. There is mild concentric left ventricular hypertrophy. Left ventricular diastolic parameters are indeterminate.  LV Wall Scoring: The inferior wall and basal inferoseptal segment are hypokinetic. The entire anterior wall, entire lateral wall, entire anterior septum, entire apex, and mid inferoseptal segment are normal. Right Ventricle: The right ventricular size is normal. No increase in right ventricular wall thickness. Right ventricular systolic function is normal. Tricuspid regurgitation signal is inadequate for assessing PA pressure. Left Atrium: Left atrial size was normal in size. Right Atrium: Right atrial size was  normal in size. Pericardium: There is no evidence of pericardial effusion. Mitral Valve: The mitral valve is grossly normal. Trivial mitral valve regurgitation. Tricuspid Valve: The tricuspid valve is grossly normal. Tricuspid valve regurgitation is trivial. Aortic Valve: The aortic valve was not well visualized. Aortic valve regurgitation is not visualized. Aortic valve mean gradient measures 2.0 mmHg. Aortic valve peak gradient measures 4.7 mmHg. Aortic valve area, by VTI measures 2.43 cm. Pulmonic Valve: The pulmonic valve was not well visualized. Pulmonic valve regurgitation is not visualized. Aorta: The aortic root is normal in size and structure. Venous: The inferior vena cava is normal in size with greater than 50% respiratory variability, suggesting right atrial pressure of 3 mmHg. IAS/Shunts: No atrial level shunt detected by color flow Doppler.  LEFT VENTRICLE PLAX 2D LVIDd:         4.40 cm     Diastology LVIDs:         3.10 cm     LV e' medial:    8.59 cm/s LV PW:         1.20 cm     LV E/e' medial:  7.7 LV IVS:        1.10 cm     LV e' lateral:   7.62 cm/s LVOT diam:     2.10 cm     LV E/e' lateral: 8.6 LV SV:         53 LV SV Index:   29 LVOT Area:     3.46 cm  LV Volumes (MOD) LV vol d, MOD A2C: 70.3 ml LV vol d, MOD A4C: 93.4 ml LV vol s, MOD A2C: 31.5 ml LV vol s, MOD A4C: 48.8 ml LV SV MOD A2C:     38.8 ml LV SV MOD A4C:     93.4 ml LV SV MOD BP:      43.3 ml RIGHT VENTRICLE             IVC RV Basal diam:  3.10 cm     IVC diam: 1.90 cm RV S prime:     13.10 cm/s TAPSE (M-mode): 1.9 cm LEFT ATRIUM           Index        RIGHT ATRIUM           Index LA diam:  3.70 cm 2.01 cm/m   RA Area:     12.00 cm LA Vol (A4C): 49.1 ml 26.70 ml/m  RA Volume:   24.10 ml  13.11 ml/m  AORTIC VALVE AV Area (Vmax):    2.69 cm AV Area (Vmean):   2.53 cm AV Area (VTI):     2.43 cm AV Vmax:           108.00 cm/s AV Vmean:          70.600 cm/s AV VTI:            0.218 m AV Peak Grad:      4.7 mmHg AV Mean Grad:       2.0 mmHg LVOT Vmax:         83.80 cm/s LVOT Vmean:        51.500 cm/s LVOT VTI:          0.153 m LVOT/AV VTI ratio: 0.70  AORTA Ao Root diam: 3.30 cm Ao Asc diam:  3.30 cm MITRAL VALVE MV Area (PHT): 2.37 cm    SHUNTS MV Decel Time: 320 msec    Systemic VTI:  0.15 m MV E velocity: 65.80 cm/s  Systemic Diam: 2.10 cm MV A velocity: 62.50 cm/s MV E/A ratio:  1.05 Nona Dell MD Electronically signed by Nona Dell MD Signature Date/Time: 01/26/2022/4:44:19 PM    Final    MR BRAIN WO CONTRAST  Result Date: 01/26/2022 CLINICAL DATA:  Stroke follow-up. EXAM: MRI HEAD WITHOUT CONTRAST TECHNIQUE: Multiplanar, multiecho pulse sequences of the brain and surrounding structures were obtained without intravenous contrast. COMPARISON:  CT/CTA head and neck 1 day prior FINDINGS: Brain: There is an acute infarct in the right thalamus without associated hemorrhage or mass effect. There is additional punctate infarct in the high right frontal lobe cortex (2-44). There is no acute intracranial hemorrhage, extra-axial fluid collection, or acute infarct. Parenchymal volume is stable. The ventricles are stable in size. Patchy FLAIR signal abnormality in the supratentorial white matter likely reflects sequela of underlying chronic small-vessel ischemic change. This has progressed since 2013. There is a single punctate chronic microhemorrhage in the right centrum semiovale, nonspecific. There is no mass lesion.  There is no mass effect or midline shift. Vascular: Normal flow voids. Skull and upper cervical spine: Normal marrow signal. Sinuses/Orbits: There is mild mucosal thickening in the paranasal sinuses. Bilateral lens implants are in place. The globes and orbits are otherwise unremarkable. Other: None. IMPRESSION: Acute infarct in the right thalamus and punctate acute infarct in the high right frontal lobe cortex. Electronically Signed   By: Lesia Hausen M.D.   On: 01/26/2022 15:28   CT HEAD WO CONTRAST  Result  Date: 01/25/2022 CLINICAL DATA:  Stroke, follow-up EXAM: CT HEAD WITHOUT CONTRAST TECHNIQUE: Contiguous axial images were obtained from the base of the skull through the vertex without intravenous contrast. RADIATION DOSE REDUCTION: This exam was performed according to the departmental dose-optimization program which includes automated exposure control, adjustment of the mA and/or kV according to patient size and/or use of iterative reconstruction technique. COMPARISON:  01/25/2022 2:06 p.m. FINDINGS: Brain: No evidence of acute infarction, hemorrhage, mass, mass effect, or midline shift. No hydrocephalus or extra-axial fluid collection. Vascular: No hyperdense vessel. Atherosclerotic calcifications in the intracranial carotid and vertebral arteries. Skull: Normal. Negative for fracture or focal lesion. Sinuses/Orbits: Mucosal thickening in the ethmoid air cells. Status post bilateral lens replacements. Other: The mastoid air cells are well aerated. IMPRESSION: No acute intracranial process. Electronically Signed  By: Wiliam Ke M.D.   On: 01/25/2022 21:02   CT ANGIO HEAD NECK W WO CM (CODE STROKE)  Result Date: 01/25/2022 CLINICAL DATA:  Code stroke EXAM: CT ANGIOGRAPHY HEAD AND NECK TECHNIQUE: Multidetector CT imaging of the head and neck was performed using the standard protocol during bolus administration of intravenous contrast. Multiplanar CT image reconstructions and MIPs were obtained to evaluate the vascular anatomy. Carotid stenosis measurements (when applicable) are obtained utilizing NASCET criteria, using the distal internal carotid diameter as the denominator. RADIATION DOSE REDUCTION: This exam was performed according to the departmental dose-optimization program which includes automated exposure control, adjustment of the mA and/or kV according to patient size and/or use of iterative reconstruction technique. CONTRAST:  41mL OMNIPAQUE IOHEXOL 350 MG/ML SOLN COMPARISON:  Same-day noncontrast  head CT, CTA neck 12/17/2011, MRA head and neck 12/16/2011. FINDINGS: CTA NECK FINDINGS Aortic arch: There is mild calcified plaque in the aortic arch. The origins of the major branch vessels are patent. The subclavian arteries are patent to the level imaged. Right carotid system: The right common carotid artery is patent. There is mixed plaque in the proximal internal carotid artery without hemodynamically significant stenosis relative to the distal vessel. The distal internal carotid artery is widely patent. The external carotid artery is patent. There is no dissection or aneurysm. Left carotid system: The left common, internal, and external carotid arteries are patent, with mild plaque at the bifurcation but no hemodynamically significant stenosis or occlusion. There is no dissection or aneurysm. Vertebral arteries: The vertebral arteries are patent, without hemodynamically significant stenosis or occlusion. There is no dissection or aneurysm. Skeleton: There is disc space narrowing and degenerative endplate change in the lower cervical spine. There is no acute osseous abnormality or suspicious osseous lesion. There is no visible canal hematoma. Other neck: The soft tissues of the neck are unremarkable. Upper chest: The imaged lung apices are clear. Review of the MIP images confirms the above findings CTA HEAD FINDINGS Anterior circulation: There is mild calcified plaque in the carotid siphons without significant stenosis or occlusion. The bilateral MCAs are patent, without proximal stenosis or occlusion. The bilateral ACAs are patent, without proximal stenosis or occlusion. The right A1 segment is hypoplastic/absent, a normal variant. There is no aneurysm or AVM. Posterior circulation: The bilateral V4 segments are patent. The basilar artery is patent. The major cerebellar arteries appear patent. The bilateral PCAs are patent, without proximal stenosis or occlusion. There is a fetal origin of the right PCA. A  diminutive left posterior communicating artery is also identified. There is no aneurysm or AVM. Venous sinuses: Patent. Anatomic variants: As above. Review of the MIP images confirms the above findings IMPRESSION: 1. No emergent large vessel occlusion. 2. Atherosclerotic plaque at the carotid bifurcations, right more than left, without hemodynamically significant stenosis or occlusion. Findings communicated to Dr. Amada Jupiter via Amion at 2:30 p.m. Electronically Signed   By: Lesia Hausen M.D.   On: 01/25/2022 14:32   CT HEAD CODE STROKE WO CONTRAST  Result Date: 01/25/2022 CLINICAL DATA:  Code stroke. EXAM: CT HEAD WITHOUT CONTRAST TECHNIQUE: Contiguous axial images were obtained from the base of the skull through the vertex without intravenous contrast. RADIATION DOSE REDUCTION: This exam was performed according to the departmental dose-optimization program which includes automated exposure control, adjustment of the mA and/or kV according to patient size and/or use of iterative reconstruction technique. COMPARISON:  Head CT 12/16/2011 FINDINGS: Brain: There is no acute intracranial hemorrhage, extra-axial fluid collection, or  acute infarct. Parenchymal volume is within expected limits for age. The ventricles are normal in size. Gray-white differentiation is preserved. There is mild background chronic small-vessel ischemic change. There is no mass lesion.  There is no mass effect or midline shift. Vascular: There is calcification of the bilateral carotid siphons. Skull: Normal. Negative for fracture or focal lesion. Sinuses/Orbits: There is mild mucosal thickening in the paranasal sinuses. Bilateral lens implants are in place. The globes and orbits are otherwise unremarkable. Other: None. ASPECTS [facility] Stroke Program Early CT Score) - Ganglionic level infarction (caudate, lentiform nuclei, internal capsule, insula, M1-M3 cortex): 7 - Supraganglionic infarction (M4-M6 cortex): 3 Total score (0-10 with 10  being normal): 10 IMPRESSION: 1. No acute intracranial pathology. 2. ASPECTS is 10 Findings communicated via Amion to Dr. Amada Jupiter at 2:11 p.m. Electronically Signed   By: Lesia Hausen M.D.   On: 01/25/2022 14:13    Assessment: 76 y.o. male with hx of hypertension, hyperlipidemia, diabetes, shingles presented to ER for worsening left-sided numbness. He was just discharged yesterday for right thalamic and right frontal small strokes.  Received TNK on 01/25/22.  Today last seen at baseline 9am, NIHSS=3. Pt not TNK candidate due to recent stroke and nondisabling symptoms. Not IR candidate with no sign of LVO. Will do MRI to rule out previous stroke extension. Will also perform MRA at the same time. If pass swallow, will continue the DAPT and statin. PT/OT  Stroke Risk Factors - diabetes mellitus, hyperlipidemia, and hypertension  Plan: Continue further work up  Frequent neuro checks Telemetry monitoring MRI brain and MRA head PT/OT consult Permissive hypertension (only treat if BP > 220/120 unless a lower blood pressure is clinically necessary) for 24-48 hours post stroke onset GI and DVT prophylaxis  If pass swallow, will continue home DAPT and statin Stroke risk factor modification Discussed with ED PA We will follow  Thank you for this consultation and allowing [place] to participate in the care of this patient.  Marvel Plan, MD PhD Stroke Neurology 01/28/2022 3:09 PM

## 2022-01-28 NOTE — Consult Note (Signed)
Hospital Consult    Reason for Consult: Symptomatic right ICA lesion Referring Physician:  Dr. Roda Shutters MRN #:  [id number]  History of Present Illness This is a 76 y.o. male from [place] here visiting family with risk factors for vascular disease hypertension, hyperlipidemia and diabetes.  He has not a former smoker but quit about 50 years ago.  Initially presented 3 days ago with left-sided numbness and weakness received TNK with unremarkable CTA head and neck with MRI demonstrating thalamic infarct discharged on aspirin and Plavix.  Return to the day with left facial droop left numbness in the hand and left leg mostly has resolved although still has some numbness of the left lateral leg but otherwise doing well.  Has undergone repeat MRI and now consulted for consideration of repair symptomatic right ICA lesion.  Past Medical History:  Diagnosis Date   Diabetes mellitus without complication (HCC)    HLD (hyperlipidemia)    Shingles    TIA (transient ischemic attack) 12/16/2011    Past Surgical History:  Procedure Laterality Date   NO PAST SURGERIES      Allergies  Allergen Reactions   Altace [Ramipril] Anaphylaxis   Cortizone-10 [Hydrocortisone] Other (See Comments)    Vision problems- affected ability to see far away    Prior to Admission medications   Medication Sig Start Date End Date Taking? Authorizing Provider  aspirin 81 MG chewable tablet Chew 1 tablet (81 mg total) by mouth daily. 01/28/22  Yes Hetty Blend C, NP  atorvastatin (LIPITOR) 40 MG tablet Take 1 tablet (40 mg total) by mouth daily. 01/28/22  Yes Hetty Blend C, NP  clopidogrel (PLAVIX) 75 MG tablet Take 1 tablet (75 mg total) by mouth daily. 01/28/22  Yes Hetty Blend C, NP  glimepiride (AMARYL) 1 MG tablet Take 1 mg by mouth daily.   Yes [provider]  finasteride (PROSCAR) 5 MG tablet Take 5 mg by mouth daily.  11/09/14   [provider]  losartan (COZAAR) 50 MG tablet Take 50 mg by mouth  daily.    [provider]  metFORMIN (GLUCOPHAGE) 850 MG tablet Take 1 tablet (850 mg total) by mouth 3 (three) times daily. Resume on Saturday 12/20/2011 secondary to contrast study performed on  12/17/2011. Patient taking differently: Take 1,000 mg by mouth 2 (two) times daily with a meal. 12/18/11   Altha Harm, MD  NOVOLOG FLEXPEN 100 UNIT/ML FlexPen Inject 12-16 Units into the skin in the morning.    [provider]  omeprazole (PRILOSEC) 20 MG capsule Take 20 mg by mouth daily. 01/16/22   [provider]  sildenafil (VIAGRA) 50 MG tablet Take 50 mg by mouth daily as needed. 11/05/21   [provider]  tamsulosin (FLOMAX) 0.4 MG CAPS capsule Take 0.4 mg by mouth daily. 01/21/22   [provider]  TOUJEO SOLOSTAR 300 UNIT/ML SOPN Inject 30 Units into the skin at bedtime. 06/28/18   [provider]    Social History   Socioeconomic History   Marital status: Married    Spouse name: Not on file   Number of children: Not on file   Years of education: Not on file   Highest education level: Not on file  Occupational History   Not on file  Tobacco Use   Smoking status: Former    Years: 7.00    Types: Cigarettes   Smokeless tobacco: Never  Substance and Sexual Activity   Alcohol use: Yes    Alcohol/week: 0.0 standard  drinks of alcohol    Comment: occasional wine   Drug use: No   Sexual activity: Not on file  Other Topics Concern   Not on file  Social History Narrative   Married has three children two boys and one girl    Social Determinants of Health   Financial Resource Strain: Not on file  Food Insecurity: No Food Insecurity (08/03/2018)   Hunger Vital Sign    Worried About Running Out of Food in the Last Year: Never true    Ran Out of Food in the Last Year: Never true  Transportation Needs: No Transportation Needs (08/03/2018)   PRAPARE - Administrator, Civil ServiceTransportation    Lack of Transportation (Medical): No    Lack of  Transportation (Non-Medical): No  Physical Activity: Not on file  Stress: Not on file  Social Connections: Not on file  Intimate Partner Violence: Not on file     Family History  Problem Relation Age of Onset   Broken bones Mother    Diabetes Father     Review of Systems  Constitutional: Negative.   HENT: Negative.    Eyes: Negative.   Cardiovascular: Negative.   Gastrointestinal: Negative.   Genitourinary: Negative.   Musculoskeletal: Negative.   Skin: Negative.   Neurological:  Positive for sensory change and focal weakness.  Psychiatric/Behavioral: Negative.        Physical Examination  Vitals:   01/28/22 1345 01/28/22 1537  BP: 130/73   Pulse: 83   Resp: 13   Temp:  97.9 F (36.6 C)  SpO2: 97%    There is no height or weight on file to calculate BMI.  Physical Exam HENT:     Head: Normocephalic.     Nose: Nose normal.     Mouth/Throat:     Mouth: Mucous membranes are moist.  Cardiovascular:     Rate and Rhythm: Normal rate.     Pulses: Normal pulses.  Pulmonary:     Effort: Pulmonary effort is normal.  Abdominal:     General: Abdomen is flat.     Palpations: Abdomen is soft.  Musculoskeletal:        General: Normal range of motion.     Cervical back: Normal range of motion and neck supple.     Right lower leg: No edema.     Left lower leg: No edema.  Skin:    General: Skin is warm.     Capillary Refill: Capillary refill takes less than 2 seconds.  Neurological:     General: No focal deficit present.     Mental Status: He is alert. Mental status is at baseline. He is disoriented.      CBC    Component Value Date/Time   WBC 10.0 01/28/2022 1210   RBC 5.46 01/28/2022 1210   HGB 15.0 01/28/2022 1211   HCT 44.0 01/28/2022 1211   PLT 185 01/28/2022 1210   MCV 81.1 01/28/2022 1210   MCH 26.4 01/28/2022 1210   MCHC 32.5 01/28/2022 1210   RDW 14.7 01/28/2022 1210   LYMPHSABS 2.1 01/28/2022 1210   MONOABS 0.8 01/28/2022 1210   EOSABS 0.2  01/28/2022 1210   BASOSABS 0.1 01/28/2022 1210    BMET    Component Value Date/Time   NA 140 01/28/2022 1335   K 3.9 01/28/2022 1335   CL 105 01/28/2022 1335   CO2 25 01/28/2022 1335   GLUCOSE 178 (H) 01/28/2022 1335   BUN 15 01/28/2022 1335   CREATININE 1.08 01/28/2022 1335  CREATININE 0.99 12/18/2014 1132   CALCIUM 10.0 01/28/2022 1335   GFRNONAA >60 01/28/2022 1335   GFRAA >90 12/16/2011 1855    COAGS: Lab Results  Component Value Date   INR 1.0 01/28/2022   INR 1.1 01/25/2022     Non-Invasive Vascular Imaging:   CTA NECK FINDINGS   Aortic arch: There is mild calcified plaque in the aortic arch. The origins of the major branch vessels are patent. The subclavian arteries are patent to the level imaged.   Right carotid system: The right common carotid artery is patent. There is mixed plaque in the proximal internal carotid artery without hemodynamically significant stenosis relative to the distal vessel. The distal internal carotid artery is widely patent. The external carotid artery is patent. There is no dissection or aneurysm.   Left carotid system: The left common, internal, and external carotid arteries are patent, with mild plaque at the bifurcation but no hemodynamically significant stenosis or occlusion. There is no dissection or aneurysm.   Vertebral arteries: The vertebral arteries are patent, without hemodynamically significant stenosis or occlusion. There is no dissection or aneurysm.   Skeleton: There is disc space narrowing and degenerative endplate change in the lower cervical spine. There is no acute osseous abnormality or suspicious osseous lesion. There is no visible canal hematoma.   Other neck: The soft tissues of the neck are unremarkable.   Upper chest: The imaged lung apices are clear.   Review of the MIP images confirms the above findings   CTA HEAD FINDINGS   Anterior circulation: There is mild calcified plaque in the  carotid siphons without significant stenosis or occlusion.   The bilateral MCAs are patent, without proximal stenosis or occlusion.   The bilateral ACAs are patent, without proximal stenosis or occlusion. The right A1 segment is hypoplastic/absent, a normal variant.   There is no aneurysm or AVM.   Posterior circulation: The bilateral V4 segments are patent. The basilar artery is patent. The major cerebellar arteries appear patent.   The bilateral PCAs are patent, without proximal stenosis or occlusion. There is a fetal origin of the right PCA. A diminutive left posterior communicating artery is also identified.   There is no aneurysm or AVM.   Venous sinuses: Patent.   Anatomic variants: As above.   Review of the MIP images confirms the above findings   IMPRESSION: 1. No emergent large vessel occlusion. 2. Atherosclerotic plaque at the carotid bifurcations, right more than left, without hemodynamically significant stenosis or occlusion.  MRI Brain IMPRESSION: 1. Multiple new small acute infarcts in the right frontal and parietal lobes. 2. Unchanged recent right thalamic infarct. 3. Mild chronic small vessel ischemic disease.  ASSESSMENT/PLAN: 76 year old male with what appears to be a symptomatic right ICA thrombotic lesion.  Though by NASCET criteria there is only minimal stenosis when taken the proximal ICA lumen compared to the size of the artery there is at least 50% stenosis and given that he has not had 2 symptomatic episodes I have recommended repair.  I discussed the options being carotid endarterectomy versus transcarotid artery stenting and given that patient is on aspirin and Plavix and appears to be a good candidate for stent we will likely plan for transcarotid artery stenting on Friday this week.  Continue dual antiplatelet therapy and likely will benefit from therapeutic anticoagulation with heparin while inpatient to prevent further strokelike events.  I  discussed the inherent risks and benefits of the procedures with the wife and daughter and they demonstrate  good understanding we will plan for repair on Friday.  Akilah Cureton C. Randie Heinz, MD [facility] Office: [phone] Pager: [phone]

## 2022-01-28 NOTE — ED Provider Notes (Signed)
[facility] EMERGENCY DEPARTMENT Provider Note   CSN: [id number] Arrival date & time: 01/28/22  1203     History No chief complaint on file.   Gavin [name] is a 76 y.o. male with a past medical history of hypertension, hyperlipidemia, type 2 diabetes, TIA and recent CVA 2 days ago given TNK presenting today due to stroke symptoms.  He reports that last time he had numbness to the left extremity nose and mouth.  Upon discharge he no longer had extremity numbness.  Today around 7 AM he started to have repeat numbness of the left arm and left leg.  No slurred speech, blurred vision, aphasia, ataxia or weakness.  He says that it is getting progressively worse.  HPI     Home Medications Prior to Admission medications   Medication Sig Start Date End Date Taking? Authorizing Provider  aspirin 81 MG chewable tablet Chew 1 tablet (81 mg total) by mouth daily. 01/28/22   Lynnae January, NP  atorvastatin (LIPITOR) 40 MG tablet Take 1 tablet (40 mg total) by mouth daily. 01/28/22   Lynnae January, NP  clopidogrel (PLAVIX) 75 MG tablet Take 1 tablet (75 mg total) by mouth daily. 01/28/22   Lynnae January, NP  finasteride (PROSCAR) 5 MG tablet Take 5 mg by mouth daily.  11/09/14   [provider]  glimepiride (AMARYL) 1 MG tablet Take 1 mg by mouth daily.    [provider]  losartan (COZAAR) 50 MG tablet Take 50 mg by mouth daily.    [provider]  metFORMIN (GLUCOPHAGE) 850 MG tablet Take 1 tablet (850 mg total) by mouth 3 (three) times daily. Resume on Saturday 12/20/2011 secondary to contrast study performed on  12/17/2011. Patient taking differently: Take 1,000 mg by mouth 2 (two) times daily with a meal. 12/18/11   Altha Harm, MD  NOVOLOG FLEXPEN 100 UNIT/ML FlexPen Inject 16 Units into the skin in the morning.    [provider]  omeprazole (PRILOSEC) 20 MG capsule Take 20 mg by mouth daily. 01/16/22   [provider]   sildenafil (VIAGRA) 50 MG tablet Take 50 mg by mouth daily as needed. 11/05/21   [provider]  tamsulosin (FLOMAX) 0.4 MG CAPS capsule Take 0.4 mg by mouth daily. 01/21/22   [provider]  TOUJEO SOLOSTAR 300 UNIT/ML SOPN Inject 30 Units into the skin at bedtime. 06/28/18   [provider]      Allergies    Altace [ramipril] and Cortizone-10 [hydrocortisone]    Review of Systems   Review of Systems  Physical Exam Updated Vital Signs There were no vitals taken for this visit. Physical Exam Vitals and nursing note reviewed.  Constitutional:      Appearance: Normal appearance.  HENT:     Head: Normocephalic and atraumatic.  Eyes:     General: No scleral icterus.    Conjunctiva/sclera: Conjunctivae normal.  Pulmonary:     Effort: Pulmonary effort is normal. No respiratory distress.  Skin:    Findings: No rash.  Neurological:     Mental Status: He is alert.     Comments: Cranial nerves II through XII grossly intact.  Pinpoint pupils, minimally reactive bilaterally.  No problem with finger-nose testing.  Normal strength in bilateral upper and lower extremities.  Subjective sensation deficit in the left extremities when compared to right.  No aphasia or dysarthria  Psychiatric:        Mood and Affect: Mood normal.  ED Results / Procedures / Treatments   Labs (all labs ordered are listed, but only abnormal results are displayed) Labs Reviewed  CBG MONITORING, ED - Abnormal; Notable for the following components:      Result Value   Glucose-Capillary 242 (*)    All other components within normal limits  I-STAT CHEM 8, ED - Abnormal; Notable for the following components:   Potassium 6.4 (*)    BUN 24 (*)    Glucose, Bld 257 (*)    Calcium, Ion 1.13 (*)    All other components within normal limits  PROTIME-INR  APTT  CBC  DIFFERENTIAL  ETHANOL  COMPREHENSIVE METABOLIC PANEL  RAPID URINE DRUG SCREEN, HOSP PERFORMED  URINALYSIS, ROUTINE W  REFLEX MICROSCOPIC  I-STAT CHEM 8, ED    EKG None  Radiology CT HEAD CODE STROKE WO CONTRAST  Result Date: 01/28/2022 CLINICAL DATA:  Code stroke. Neuro deficit, acute, stroke suspected. EXAM: CT HEAD WITHOUT CONTRAST TECHNIQUE: Contiguous axial images were obtained from the base of the skull through the vertex without intravenous contrast. RADIATION DOSE REDUCTION: This exam was performed according to the departmental dose-optimization program which includes automated exposure control, adjustment of the mA and/or kV according to patient size and/or use of iterative reconstruction technique. COMPARISON:  MRI 2 days ago.  Head CT 3 days ago. FINDINGS: Brain: The brainstem and cerebellum appear normal. Focal low-density in the posterolateral right thalamus corresponds with the acute infarction shown by recent MRI. No evidence of progression or hemorrhage. Otherwise, cerebral hemispheres show age related volume loss without other acute insult. Small right posterior frontal cortical infarction shown by MRI is not visible. No mass, hemorrhage, hydrocephalus or extra-axial collection. Vascular: Major vessels at the base of the brain show flow. Skull: Negative Sinuses/Orbits: Mild mucosal thickening.  Orbits normal. Other: None ASPECTS ([place] Stroke Program Early CT Score) - Ganglionic level infarction (caudate, lentiform nuclei, internal capsule, insula, M1-M3 cortex): 7 - Supraganglionic infarction (M4-M6 cortex): 3 Total score (0-10 with 10 being normal): 10 IMPRESSION: 1. Focal low-density in the posterolateral right thalamus corresponds with the acute infarction shown by recent MRI. No evidence of progression or hemorrhage. 2. Small right posterior frontal cortical infarction shown by MRI is not visible. No other finding. 3. Aspects is 10. These results were communicated to Dr. Erlinda Hong at 12:27 pm on 01/28/2022 by text page via the [facility] messaging system. Electronically Signed   By: Nelson Chimes M.D.   On:  01/28/2022 12:28  Procedures Procedures   Medications Ordered in ED Medications  aspirin chewable tablet 81 mg (has no administration in time range)  atorvastatin (LIPITOR) tablet 40 mg (has no administration in time range)  pantoprazole (PROTONIX) EC tablet 40 mg (has no administration in time range)  finasteride (PROSCAR) tablet 5 mg (has no administration in time range)  tamsulosin (FLOMAX) capsule 0.4 mg (has no administration in time range)  clopidogrel (PLAVIX) tablet 75 mg (has no administration in time range)   stroke: early stages of recovery book (has no administration in time range)  acetaminophen (TYLENOL) tablet 650 mg (has no administration in time range)    Or  acetaminophen (TYLENOL) 160 MG/5ML solution 650 mg (has no administration in time range)    Or  acetaminophen (TYLENOL) suppository 650 mg (has no administration in time range)  senna-docusate (Senokot-S) tablet 1 tablet (has no administration in time range)  enoxaparin (LOVENOX) injection 40 mg (has no administration in time range)    ED Course/ Medical Decision Making/  A&P                           Medical Decision Making Amount and/or Complexity of Data Reviewed Labs: ordered. Radiology: ordered.  Risk Decision regarding hospitalization.   76 year old male presenting today as a code stroke.  Had acute onset left upper and left lower extremity numbness, similar to when he had a thalamic stroke 3 days ago.  Was discharged from the hospital yesterday.     Past Medical History / Co-morbidities / Social History: Hypertension, hyperlipidemia, type 2 diabetes, recent CVA   Additional history: Per chart review patient was admitted to the hospital 3 days ago after getting TNK due to strokelike symptoms.  He was seen by Dr. Leonel Ramsay at that time.  3 days ago he had sudden onset left mouth numbness that proceeded to spread to his left arm and left leg.  He also had ataxia at that time.  He had TNK  administered and MRI revealed an acute infarct in the right thalamus Mall right-sided frontal lobe infarct.   Physical Exam: No objective findings.  Subjective numbness of left upper and left lower extremities  Lab Tests: I ordered, and personally interpreted labs.  The pertinent results include: K 6.4 on I stat, CMP pending   Imaging Studies: Code stroke head CT ordered prior to my evaluation.  I viewed and interpreted this and agree that the findings on the CT scan are consistent with the MRI a couple days ago.  No new findings.   Cardiac Monitoring:  The patient was maintained on a cardiac monitor.  I viewed and interpreted the cardiac monitored which showed an underlying rhythm of: NSR   Medications: N/A   Consultations Obtained: I spoke with Erlinda Hong with neurology and he states he will order MRI imaging but requests admission to hospitalist. Suggests PT/OT as well.  MDM/Disposition: This is a 76 year old male who presented today as a code stroke.  Recently had a pelvic stroke and was given TNK at that time.  Spoke with Dr. Erlinda Hong who requests a hospital admission.  Patient admitted to Dr. Trilby Drummer    I discussed this case with my attending physician Dr. Tyrone Nine who cosigned this note including patient's presenting symptoms, physical exam, and planned diagnostics and interventions. Attending physician stated agreement with plan or made changes to plan which were implemented.    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 Verdis Prime, [place], PA-C 01/28/22 [place], [employer], DO 01/28/22 1330

## 2022-01-28 NOTE — H&P (Signed)
History and Physical   Gavin [name] RKY:[id number] DOB: [birth year] DOA: 01/28/2022  PCP: Pcp, No   Patient coming from: Home  Chief Complaint: Left-sided numbness  HPI: Gavin [name] is a 76 y.o. male with medical history significant of BPH, diabetes, seizure, GERD, hyperlipidemia, hypertension, CVA presenting with left-sided numbness.  Patient was recently admitted to the hospital on the neurology service after having an acute CVA 2 days ago.  He received TNK and had no residual deficits on discharge except for some perioral numbness.  Today around 7 AM he noted recurrent left arm and left leg numbness that have been gradually increasing.  He also felt generally weak in the legs.  The numbness in the arm seems to be improving but he still feels numbness on the lateral left leg.  He did have some nausea last night and a sore throat which persisted a little this morning and he received NyQuil for this.  He denies fevers, chills, chest pain, shortness breath, abdominal pain, diarrhea.  Of note, patient spends much of the year in [place] and that is where his medical providers are.  ED Course: Vital signs in the ED significant for blood pressure in the 150s systolic.  Lab workup included CMP which is pending, i-STAT which showed potassium of 6.4 waiting on formal CMP.  CBC with normal limits.  PT, PTT, INR within normal limits.  Ethanol level UDS and urinalysis pending.  Last admission A1c elevated at 10.7 and lipid panel normal.  Had echo done at that time as well.  In the ED today CT head showed focal density consistent with previously seen stroke on MRI from last admission and no other acute finding.  MR brain and MRA has been ordered but not yet done.  Neurology has been in consult and are following.  Review of Systems: As per HPI otherwise all other systems reviewed and are negative.  Past Medical History:  Diagnosis Date   Diabetes mellitus without complication (HCC)    HLD  (hyperlipidemia)    Shingles    TIA (transient ischemic attack) 12/16/2011    Past Surgical History:  Procedure Laterality Date   NO PAST SURGERIES      Social History  reports that he has quit smoking. His smoking use included cigarettes. He has never used smokeless tobacco. He reports current alcohol use. He reports that he does not use drugs.  Allergies  Allergen Reactions   Altace [Ramipril] Anaphylaxis   Cortizone-10 [Hydrocortisone] Other (See Comments)    Vision problems    Family History  Problem Relation Age of Onset   Broken bones Mother    Diabetes Father   Reviewed on admission  Prior to Admission medications   Medication Sig Start Date End Date Taking? Authorizing Provider  aspirin 81 MG chewable tablet Chew 1 tablet (81 mg total) by mouth daily. 01/28/22   Lynnae January, NP  atorvastatin (LIPITOR) 40 MG tablet Take 1 tablet (40 mg total) by mouth daily. 01/28/22   Lynnae January, NP  clopidogrel (PLAVIX) 75 MG tablet Take 1 tablet (75 mg total) by mouth daily. 01/28/22   Lynnae January, NP  finasteride (PROSCAR) 5 MG tablet Take 5 mg by mouth daily.  11/09/14   [provider]  glimepiride (AMARYL) 1 MG tablet Take 1 mg by mouth daily.    [provider]  losartan (COZAAR) 50 MG tablet Take 50 mg by mouth daily.    [provider]  metFORMIN (GLUCOPHAGE) 850 MG  tablet Take 1 tablet (850 mg total) by mouth 3 (three) times daily. Resume on Saturday 12/20/2011 secondary to contrast study performed on  12/17/2011. Patient taking differently: Take 1,000 mg by mouth 2 (two) times daily with a meal. 12/18/11   Leana Gamer, MD  NOVOLOG FLEXPEN 100 UNIT/ML FlexPen Inject 16 Units into the skin in the morning.    [provider]  omeprazole (PRILOSEC) 20 MG capsule Take 20 mg by mouth daily. 01/16/22   [provider]  sildenafil (VIAGRA) 50 MG tablet Take 50 mg by mouth daily as needed. 11/05/21   [provider]   tamsulosin (FLOMAX) 0.4 MG CAPS capsule Take 0.4 mg by mouth daily. 01/21/22   [provider]  TOUJEO SOLOSTAR 300 UNIT/ML SOPN Inject 30 Units into the skin at bedtime. 06/28/18   [provider]    Physical Exam: Vitals:   01/28/22 1231  BP: (!) 152/72  Pulse: 96    Physical Exam Constitutional:      General: He is not in acute distress.    Appearance: Normal appearance.  HENT:     Head: Normocephalic and atraumatic.     Mouth/Throat:     Mouth: Mucous membranes are moist.     Pharynx: Oropharynx is clear.  Eyes:     Extraocular Movements: Extraocular movements intact.     Pupils: Pupils are equal, round, and reactive to light.  Cardiovascular:     Rate and Rhythm: Normal rate and regular rhythm.     Pulses: Normal pulses.     Heart sounds: Normal heart sounds.  Pulmonary:     Effort: Pulmonary effort is normal. No respiratory distress.     Breath sounds: Normal breath sounds.  Abdominal:     General: Bowel sounds are normal. There is no distension.     Palpations: Abdomen is soft.     Tenderness: There is no abdominal tenderness.  Musculoskeletal:        General: No swelling or deformity.  Skin:    General: Skin is warm and dry.  Neurological:     Comments: Mental Status: Patient is awake, alert, oriented x3 No signs of aphasia or neglect Cranial Nerves: II: Pupils equal, round, and reactive to light.   III,IV, VI: Difficulty maintaining gaze down and to the left V: Facial sensation is symmetric to light touch. VII: Facial movement is symmetric.  VIII: hearing is intact to voice X: Uvula elevates symmetrically XI: Shoulder shrug is symmetric. XII: tongue is midline without atrophy or fasciculations.  Motor: Good effort thorughout, at Least 5/5 bilateral UE, 5/5 bilateral lower extremitiy  Sensory: Sensation is creased at left lateral lower extremity and mildly decreased at left upper extremity laterally.  Also reports some residual left  facial numbness and perioral numbness which from previous stroke. Cerebellar: Finger-Nose intact bilalat    Labs on Admission: I have personally reviewed following labs and imaging studies  CBC: Recent Labs  Lab 01/25/22 1354 01/25/22 1405 01/28/22 1210 01/28/22 1211  WBC 9.8  --  10.0  --   NEUTROABS 6.0  --  6.8  --   HGB 14.1 13.9 14.4 15.0  HCT 43.7 41.0 44.3 44.0  MCV 82.5  --  81.1  --   PLT 185  --  185  --     Basic Metabolic Panel: Recent Labs  Lab 01/25/22 1354 01/25/22 1405 01/28/22 1211  NA 139 139 135  K 4.3 4.3 6.4*  CL 104 107 102  CO2  22  --   --   GLUCOSE 178* 180* 257*  BUN 17 20 24*  CREATININE 1.04 0.90 1.00  CALCIUM 9.9  --   --     GFR: Estimated Creatinine Clearance: 60 mL/min (by C-G formula based on SCr of 1 mg/dL).  Liver Function Tests: Recent Labs  Lab 01/25/22 1354  AST 27  ALT 20  ALKPHOS 58  BILITOT 0.6  PROT 6.5  ALBUMIN 4.1    Urine analysis:    Component Value Date/Time   COLORURINE YELLOW 12/16/2011 1135   APPEARANCEUR CLEAR 12/16/2011 1135   LABSPEC 1.024 12/16/2011 1135   PHURINE 6.0 12/16/2011 1135   GLUCOSEU >1000 (A) 12/16/2011 1135   HGBUR NEGATIVE 12/16/2011 1135   BILIRUBINUR NEGATIVE 12/16/2011 1135   KETONESUR TRACE (A) 12/16/2011 1135   PROTEINUR NEGATIVE 12/16/2011 1135   UROBILINOGEN 0.2 12/16/2011 1135   NITRITE NEGATIVE 12/16/2011 1135   LEUKOCYTESUR NEGATIVE 12/16/2011 1135    Radiological Exams on Admission: CT HEAD CODE STROKE WO CONTRAST  Result Date: 01/28/2022 CLINICAL DATA:  Code stroke. Neuro deficit, acute, stroke suspected. EXAM: CT HEAD WITHOUT CONTRAST TECHNIQUE: Contiguous axial images were obtained from the base of the skull through the vertex without intravenous contrast. RADIATION DOSE REDUCTION: This exam was performed according to the departmental dose-optimization program which includes automated exposure control, adjustment of the mA and/or kV according to patient size and/or  use of iterative reconstruction technique. COMPARISON:  MRI 2 days ago.  Head CT 3 days ago. FINDINGS: Brain: The brainstem and cerebellum appear normal. Focal low-density in the posterolateral right thalamus corresponds with the acute infarction shown by recent MRI. No evidence of progression or hemorrhage. Otherwise, cerebral hemispheres show age related volume loss without other acute insult. Small right posterior frontal cortical infarction shown by MRI is not visible. No mass, hemorrhage, hydrocephalus or extra-axial collection. Vascular: Major vessels at the base of the brain show flow. Skull: Negative Sinuses/Orbits: Mild mucosal thickening.  Orbits normal. Other: None ASPECTS ([place]berta Stroke Program Early CT Score) - Ganglionic level infarction (caudate, lentiform nuclei, internal capsule, insula, M1-M3 cortex): 7 - Supraganglionic infarction (M4-M6 cortex): 3 Total score (0-10 with 10 being normal): 10 IMPRESSION: 1. Focal low-density in the posterolateral right thalamus corresponds with the acute infarction shown by recent MRI. No evidence of progression or hemorrhage. 2. Small right posterior frontal cortical infarction shown by MRI is not visible. No other finding. 3. Aspects is 10. These results were communicated to Dr. Roda Shutters at 12:27 pm on 01/28/2022 by text page via the [facility] messaging system. Electronically Signed   By: Paulina Fusi M.D.   On: 01/28/2022 12:28   ECHOCARDIOGRAM COMPLETE  Result Date: 01/26/2022    ECHOCARDIOGRAM REPORT   Patient Name:   Gavin [name] Date of Exam: 01/26/2022 Medical Rec #:  [id number]       Height:       65.0 in Accession #:    [id number]      Weight:       168.4 lb Date of Birth:  15-May-1945       BSA:          1.839 m Patient Age:    76 years        BP:           161/68 mmHg Patient Gender: M               HR:           74 bpm. Exam  Location:  Inpatient Procedure: 2D Echo, Cardiac Doppler and Color Doppler Indications:    Stroke  History:        Patient has prior  history of Echocardiogram examinations, most                 recent 01/01/2015. Risk Factors:Diabetes, Dyslipidemia,                 Hypertension and Former Smoker. GERD.  Sonographer:    Clayton Lefort RDCS (AE) Referring Phys: MCNEILL P [facility]  Sonographer Comments: Suboptimal parasternal window. IMPRESSIONS  1. Left ventricular ejection fraction, by estimation, is 50 to 55%. The left ventricle has low normal function. The left ventricle demonstrates regional wall motion abnormalities (see scoring diagram/findings for description). There is mild concentric left ventricular hypertrophy. Left ventricular diastolic parameters are indeterminate.  2. Right ventricular systolic function is normal. The right ventricular size is normal. Tricuspid regurgitation signal is inadequate for assessing PA pressure.  3. The mitral valve is grossly normal. Trivial mitral valve regurgitation.  4. The aortic valve was not well visualized. Aortic valve regurgitation is not visualized. Aortic valve mean gradient measures 2.0 mmHg.  5. The inferior vena cava is normal in size with greater than 50% respiratory variability, suggesting right atrial pressure of 3 mmHg. Comparison(s): Prior images unable to be directly viewed. FINDINGS  Left Ventricle: Left ventricular ejection fraction, by estimation, is 50 to 55%. The left ventricle has low normal function. The left ventricle demonstrates regional wall motion abnormalities. The left ventricular internal cavity size was normal in size. There is mild concentric left ventricular hypertrophy. Left ventricular diastolic parameters are indeterminate.  LV Wall Scoring: The inferior wall and basal inferoseptal segment are hypokinetic. The entire anterior wall, entire lateral wall, entire anterior septum, entire apex, and mid inferoseptal segment are normal. Right Ventricle: The right ventricular size is normal. No increase in right ventricular wall thickness. Right ventricular systolic function  is normal. Tricuspid regurgitation signal is inadequate for assessing PA pressure. Left Atrium: Left atrial size was normal in size. Right Atrium: Right atrial size was normal in size. Pericardium: There is no evidence of pericardial effusion. Mitral Valve: The mitral valve is grossly normal. Trivial mitral valve regurgitation. Tricuspid Valve: The tricuspid valve is grossly normal. Tricuspid valve regurgitation is trivial. Aortic Valve: The aortic valve was not well visualized. Aortic valve regurgitation is not visualized. Aortic valve mean gradient measures 2.0 mmHg. Aortic valve peak gradient measures 4.7 mmHg. Aortic valve area, by VTI measures 2.43 cm. Pulmonic Valve: The pulmonic valve was not well visualized. Pulmonic valve regurgitation is not visualized. Aorta: The aortic root is normal in size and structure. Venous: The inferior vena cava is normal in size with greater than 50% respiratory variability, suggesting right atrial pressure of 3 mmHg. IAS/Shunts: No atrial level shunt detected by color flow Doppler.  LEFT VENTRICLE PLAX 2D LVIDd:         4.40 cm     Diastology LVIDs:         3.10 cm     LV e' medial:    8.59 cm/s LV PW:         1.20 cm     LV E/e' medial:  7.7 LV IVS:        1.10 cm     LV e' lateral:   7.62 cm/s LVOT diam:     2.10 cm     LV E/e' lateral: 8.6 LV SV:  53 LV SV Index:   29 LVOT Area:     3.46 cm  LV Volumes (MOD) LV vol d, MOD A2C: 70.3 ml LV vol d, MOD A4C: 93.4 ml LV vol s, MOD A2C: 31.5 ml LV vol s, MOD A4C: 48.8 ml LV SV MOD A2C:     38.8 ml LV SV MOD A4C:     93.4 ml LV SV MOD BP:      43.3 ml RIGHT VENTRICLE             IVC RV Basal diam:  3.10 cm     IVC diam: 1.90 cm RV S prime:     13.10 cm/s TAPSE (M-mode): 1.9 cm LEFT ATRIUM           Index        RIGHT ATRIUM           Index LA diam:      3.70 cm 2.01 cm/m   RA Area:     12.00 cm LA Vol (A4C): 49.1 ml 26.70 ml/m  RA Volume:   24.10 ml  13.11 ml/m  AORTIC VALVE AV Area (Vmax):    2.69 cm AV Area (Vmean):    2.53 cm AV Area (VTI):     2.43 cm AV Vmax:           108.00 cm/s AV Vmean:          70.600 cm/s AV VTI:            0.218 m AV Peak Grad:      4.7 mmHg AV Mean Grad:      2.0 mmHg LVOT Vmax:         83.80 cm/s LVOT Vmean:        51.500 cm/s LVOT VTI:          0.153 m LVOT/AV VTI ratio: 0.70  AORTA Ao Root diam: 3.30 cm Ao Asc diam:  3.30 cm MITRAL VALVE MV Area (PHT): 2.37 cm    SHUNTS MV Decel Time: 320 msec    Systemic VTI:  0.15 m MV E velocity: 65.80 cm/s  Systemic Diam: 2.10 cm MV A velocity: 62.50 cm/s MV E/A ratio:  1.05 Rozann Lesches MD Electronically signed by Rozann Lesches MD Signature Date/Time: 01/26/2022/4:44:19 PM    Final    MR BRAIN WO CONTRAST  Result Date: 01/26/2022 CLINICAL DATA:  Stroke follow-up. EXAM: MRI HEAD WITHOUT CONTRAST TECHNIQUE: Multiplanar, multiecho pulse sequences of the brain and surrounding structures were obtained without intravenous contrast. COMPARISON:  CT/CTA head and neck 1 day prior FINDINGS: Brain: There is an acute infarct in the right thalamus without associated hemorrhage or mass effect. There is additional punctate infarct in the high right frontal lobe cortex (2-44). There is no acute intracranial hemorrhage, extra-axial fluid collection, or acute infarct. Parenchymal volume is stable. The ventricles are stable in size. Patchy FLAIR signal abnormality in the supratentorial white matter likely reflects sequela of underlying chronic small-vessel ischemic change. This has progressed since 2013. There is a single punctate chronic microhemorrhage in the right centrum semiovale, nonspecific. There is no mass lesion.  There is no mass effect or midline shift. Vascular: Normal flow voids. Skull and upper cervical spine: Normal marrow signal. Sinuses/Orbits: There is mild mucosal thickening in the paranasal sinuses. Bilateral lens implants are in place. The globes and orbits are otherwise unremarkable. Other: None. IMPRESSION: Acute infarct in the right thalamus  and punctate acute infarct in the high right frontal lobe cortex. Electronically  Signed   By: Valetta Mole M.D.   On: 01/26/2022 15:28    EKG: Independently reviewed.  Sinus rhythm at 85 bpm.  Apparent left right arm reversal.  Some baseline wander and nonspecific T wave changes similar to previous  Assessment/Plan Principal Problem:   Acute focal neurological deficit Active Problems:   DM (diabetes mellitus) (HCC)   HLD (hyperlipidemia)   HTN (hypertension)   GERD (gastroesophageal reflux disease)   Focal seizure (HCC)   History of CVA (cerebrovascular accident)   BPH (benign prostatic hyperplasia)   Acute focal neurologic deficit History of CVA > Presenting with recurrent left-sided numbness similar to recent stroke for which he received TNK was treated on the neurohospitalist service 2 days ago. > Also has some facial numbness which is residual from previous episode. > CT head without new abnormality just demonstrates findings consistent with previous MRI.  Repeat MRI and MRA pending. > Recent A1c 0.7 and lipid panel normal. > Neurology consulted in the ED and have seen the patient and will follow. - Appreciate neurology recommendations - Allow for permissive HTN (systolic < [id number] and diastolic < 123456)  - Continue home aspirin, Plavix, statin (increased dose) - Echocardiogram  - Follow-up MRI and MRA - Recent A1c and lipid panel in chart - Tele monitoring  - SLP eval - PT/OT  ?Hyperkalemia > I-STAT showed potassium of 6.4, unclear if this is accurate due to being on i-STAT and he had recent normal potassium.  Formal CMP is pending. - Follow-up repeat potassium  BPH - Continue home finasteride and tamsulosin  Diabetes > On 24 to 30 units nightly long-acting insulin and 10 to 15 units of short acting in the morning at home.  In addition to other medications. - 20 units long-acting insulin nightly - SSI  GERD - Continue home PPI  Hypertension - Holding home losartan in  the setting of permissive hypertension as above  Hyperlipidemia - Continue home statin as above  DVT prophylaxis: Lovenox Code Status:   Full Family Communication:  Updated at bedside Disposition Plan:   Patient is from:  Home   Anticipated DC to:  Home  Anticipated DC date:  1 to 2 days  Anticipated DC barriers: None  Consults called:  Neurology Admission status:  Observation, telemetry  Severity of Illness: The appropriate patient status for this patient is OBSERVATION. Observation status is judged to be reasonable and necessary in order to provide the required intensity of service to ensure the patient's safety. The patient's presenting symptoms, physical exam findings, and initial radiographic and laboratory data in the context of their medical condition is felt to place them at decreased risk for further clinical deterioration. Furthermore, it is anticipated that the patient will be medically stable for discharge from the hospital within 2 midnights of admission.    Marcelyn Bruins MD [facility]  How to contact the [facility] Attending or Consulting provider [place] or covering provider during after hours [place], for this patient?   Check the care team in [facility] and look for a) attending/consulting [facility] provider listed and b) the [facility] team listed Log into [email or web address] and use [place]'s universal password to access. If you do not have the password, please contact the hospital operator. Locate the [facility] provider you are looking for under [facility] and page to a number that you can be directly reached. If you still have difficulty reaching the provider, please page the [facility] (Director on Call) for the Hospitalists listed  on amion for assistance.  01/28/2022, 1:38 PM

## 2022-01-28 NOTE — Plan of Care (Signed)
MRI showed Multiple new small acute infarcts in the right frontal and parietal lobes within left MCA territory. Pt CTA head and neck 2 days ago showed right ICA mixed plaque. Felt it could be high risk plaque to explain pt recurrent stroke. Discussed with Dr. Randie Heinz, he will take a look at this pt. I appreciate the help.   Marvel Plan, MD PhD Stroke Neurology 01/28/2022 4:01 PM

## 2022-01-28 NOTE — Code Documentation (Signed)
Stroke Response Nurse Documentation Code Documentation  Boruch Manuele is a 76 y.o. male arriving to [facility]  via [facility] on 01/28/22 with past medical hx of DM, HLD, Stroke post TNK on 01/25/22. On aspirin 81 mg daily and clopidogrel 75 mg daily. Code stroke was activated by EMS.   Patient from home where he lives in [place] until December then returns to [place] for remainder of year. Patient recently admitted to [facility] 11/25-11/27 post TNK administration for acute right thalamus infarct and punctate right frontal lobe infarct. Patient woke up this AM and shortly after noticed new decreased sensation on left arm and leg and difficulty walking, EMS activated.   Stroke team at the bedside on patient arrival. Labs drawn and patient cleared for CT by EDP. Patient to CT with team. NIHSS 2, see documentation for details and code stroke times. Patient with left limb ataxia and left decreased sensation on exam.   The following imaging was completed:  CT Head. Patient is not a candidate for IV Thrombolytic due to out of window, too mild to treat. Patient is not a candidate for IR due to too mild to treat.   Care Plan: q2h mNIHSS and VS, permissive HTN BP <220/110, MRI/MRA.   Bedside handoff with ED RN Maryln Manuel.    Scarlette Slice K  Stroke Response RN

## 2022-01-28 NOTE — ED Notes (Signed)
Pt back from MRI 

## 2022-01-29 DIAGNOSIS — E875 Hyperkalemia: Secondary | ICD-10-CM | POA: Diagnosis present

## 2022-01-29 DIAGNOSIS — I10 Essential (primary) hypertension: Secondary | ICD-10-CM | POA: Diagnosis present

## 2022-01-29 DIAGNOSIS — E785 Hyperlipidemia, unspecified: Secondary | ICD-10-CM | POA: Diagnosis present

## 2022-01-29 DIAGNOSIS — Z7902 Long term (current) use of antithrombotics/antiplatelets: Secondary | ICD-10-CM | POA: Diagnosis not present

## 2022-01-29 DIAGNOSIS — Z794 Long term (current) use of insulin: Secondary | ICD-10-CM | POA: Diagnosis not present

## 2022-01-29 DIAGNOSIS — I63331 Cerebral infarction due to thrombosis of right posterior cerebral artery: Secondary | ICD-10-CM

## 2022-01-29 DIAGNOSIS — R29818 Other symptoms and signs involving the nervous system: Secondary | ICD-10-CM | POA: Diagnosis not present

## 2022-01-29 DIAGNOSIS — G8194 Hemiplegia, unspecified affecting left nondominant side: Secondary | ICD-10-CM | POA: Diagnosis present

## 2022-01-29 DIAGNOSIS — Z7982 Long term (current) use of aspirin: Secondary | ICD-10-CM | POA: Diagnosis not present

## 2022-01-29 DIAGNOSIS — R2990 Unspecified symptoms and signs involving the nervous system: Secondary | ICD-10-CM | POA: Diagnosis present

## 2022-01-29 DIAGNOSIS — Z7984 Long term (current) use of oral hypoglycemic drugs: Secondary | ICD-10-CM | POA: Diagnosis not present

## 2022-01-29 DIAGNOSIS — Z8673 Personal history of transient ischemic attack (TIA), and cerebral infarction without residual deficits: Secondary | ICD-10-CM | POA: Diagnosis not present

## 2022-01-29 DIAGNOSIS — Z87891 Personal history of nicotine dependence: Secondary | ICD-10-CM | POA: Diagnosis not present

## 2022-01-29 DIAGNOSIS — N400 Enlarged prostate without lower urinary tract symptoms: Secondary | ICD-10-CM | POA: Diagnosis present

## 2022-01-29 DIAGNOSIS — E119 Type 2 diabetes mellitus without complications: Secondary | ICD-10-CM | POA: Diagnosis present

## 2022-01-29 DIAGNOSIS — I639 Cerebral infarction, unspecified: Secondary | ICD-10-CM | POA: Diagnosis not present

## 2022-01-29 DIAGNOSIS — R29702 NIHSS score 2: Secondary | ICD-10-CM | POA: Diagnosis present

## 2022-01-29 DIAGNOSIS — R29810 Facial weakness: Secondary | ICD-10-CM | POA: Diagnosis present

## 2022-01-29 DIAGNOSIS — Z833 Family history of diabetes mellitus: Secondary | ICD-10-CM | POA: Diagnosis not present

## 2022-01-29 DIAGNOSIS — Z888 Allergy status to other drugs, medicaments and biological substances status: Secondary | ICD-10-CM | POA: Diagnosis not present

## 2022-01-29 DIAGNOSIS — R490 Dysphonia: Secondary | ICD-10-CM | POA: Diagnosis present

## 2022-01-29 DIAGNOSIS — K219 Gastro-esophageal reflux disease without esophagitis: Secondary | ICD-10-CM | POA: Diagnosis present

## 2022-01-29 DIAGNOSIS — Z79899 Other long term (current) drug therapy: Secondary | ICD-10-CM | POA: Diagnosis not present

## 2022-01-29 DIAGNOSIS — Z1152 Encounter for screening for COVID-19: Secondary | ICD-10-CM | POA: Diagnosis not present

## 2022-01-29 DIAGNOSIS — I6521 Occlusion and stenosis of right carotid artery: Secondary | ICD-10-CM | POA: Diagnosis present

## 2022-01-29 DIAGNOSIS — I63511 Cerebral infarction due to unspecified occlusion or stenosis of right middle cerebral artery: Secondary | ICD-10-CM | POA: Diagnosis present

## 2022-01-29 LAB — CBC
HCT: 40.9 % (ref 39.0–52.0)
Hemoglobin: 13.5 g/dL (ref 13.0–17.0)
MCH: 26.9 pg (ref 26.0–34.0)
MCHC: 33 g/dL (ref 30.0–36.0)
MCV: 81.5 fL (ref 80.0–100.0)
Platelets: 158 10*3/uL (ref 150–400)
RBC: 5.02 MIL/uL (ref 4.22–5.81)
RDW: 14.5 % (ref 11.5–15.5)
WBC: 10.8 10*3/uL — ABNORMAL HIGH (ref 4.0–10.5)
nRBC: 0 % (ref 0.0–0.2)

## 2022-01-29 LAB — COMPREHENSIVE METABOLIC PANEL
ALT: 14 U/L (ref 0–44)
AST: 16 U/L (ref 15–41)
Albumin: 3.6 g/dL (ref 3.5–5.0)
Alkaline Phosphatase: 47 U/L (ref 38–126)
Anion gap: 12 (ref 5–15)
BUN: 17 mg/dL (ref 8–23)
CO2: 20 mmol/L — ABNORMAL LOW (ref 22–32)
Calcium: 9.4 mg/dL (ref 8.9–10.3)
Chloride: 105 mmol/L (ref 98–111)
Creatinine, Ser: 1.11 mg/dL (ref 0.61–1.24)
GFR, Estimated: >60 mL/min (ref >60)
Glucose, Bld: 243 mg/dL — ABNORMAL HIGH (ref 70–99)
Potassium: 3.9 mmol/L (ref 3.5–5.1)
Sodium: 137 mmol/L (ref 135–145)
Total Bilirubin: 0.6 mg/dL (ref 0.3–1.2)
Total Protein: 6.1 g/dL — ABNORMAL LOW (ref 6.5–8.1)

## 2022-01-29 LAB — GLUCOSE, CAPILLARY
Glucose-Capillary: 177 mg/dL — ABNORMAL HIGH (ref 70–99)
Glucose-Capillary: 211 mg/dL — ABNORMAL HIGH (ref 70–99)

## 2022-01-29 LAB — CBG MONITORING, ED
Glucose-Capillary: 171 mg/dL — ABNORMAL HIGH (ref 70–99)
Glucose-Capillary: 270 mg/dL — ABNORMAL HIGH (ref 70–99)

## 2022-01-29 LAB — HEPARIN LEVEL (UNFRACTIONATED): Heparin Unfractionated: 0.12 IU/mL — ABNORMAL LOW (ref 0.30–0.70)

## 2022-01-29 MED ORDER — HEPARIN (PORCINE) 25000 UT/250ML-% IV SOLN
1050.0000 [IU]/h | INTRAVENOUS | Status: DC
  Administered 2022-01-29: 900 [IU]/h via INTRAVENOUS
  Administered 2022-01-30: 1050 [IU]/h via INTRAVENOUS
  Administered 2022-01-31: 1100 [IU]/h via INTRAVENOUS
  Filled 2022-01-29 (×3): qty 250

## 2022-01-29 MED ORDER — PHENOL 1.4 % MT LIQD
1.0000 | OROMUCOSAL | Status: DC | PRN
  Administered 2022-01-29: 1 via OROMUCOSAL
  Filled 2022-01-29: qty 177

## 2022-01-29 NOTE — Evaluation (Signed)
Physical Therapy Evaluation Patient Details Name: Gavin [name] MRN: [id number] DOB: 1945-04-08 Today's Date: 01/29/2022  History of Present Illness  76 yo male presents to [facility] on 11/28 with worsening L sided numbness, imbalance. Pt initially admitted 11/25 for R thalamic infarct and R frontal punctate infarct, received TNK. [facility] 11/28 without new abnormality just demonstrates findings consistent with previous MRI; MRI 11/28  shows multiple new small acute infarcts in the right frontal and parietal lobes. Plan for R transcarotid artery stenting 12/1. PMH - DM, shingles, HTN, HLD.  Clinical Impression   Pt presents with min LLE weakness, L sided decreased sensation and proprioception functionally, impaired gait, and decreased activity tolerance vs baseline.. Pt to benefit from acute PT to address deficits. Pt ambulated hallway distance without AD but with min L knee instability, all pt-corrected. PT to progress mobility as tolerated, and will continue to follow acutely.         Recommendations for follow up therapy are one component of a multi-disciplinary discharge planning process, led by the attending physician.  Recommendations may be updated based on patient status, additional functional criteria and insurance authorization.  Follow Up Recommendations Outpatient PT      Assistance Recommended at Discharge Intermittent Supervision/Assistance  Patient can return home with the following  Assistance with cooking/housework    Equipment Recommendations None recommended by PT  Recommendations for Other Services       Functional Status Assessment Patient has had a recent decline in their functional status and demonstrates the ability to make significant improvements in function in a reasonable and predictable amount of time.     Precautions / Restrictions Precautions Precautions: Fall Restrictions Weight Bearing Restrictions: No      Mobility  Bed Mobility Overal bed mobility:  Needs Assistance Bed Mobility: Supine to Sit, Sit to Supine     Supine to sit: Supervision Sit to supine: Supervision        Transfers Overall transfer level: Needs assistance Equipment used: None Transfers: Sit to/from Stand Sit to Stand: Supervision           General transfer comment: for safety    Ambulation/Gait Ambulation/Gait assistance: Min guard Gait Distance (Feet): 200 Feet Assistive device: None Gait Pattern/deviations: Step-through pattern, Decreased stride length, Knee hyperextension - left, Decreased dorsiflexion - left Gait velocity: WFL     General Gait Details: close guard for safety, min L knee hyperextension with gait but no overt snapping back or buckling  Stairs            Wheelchair Mobility    Modified Rankin (Stroke Patients Only) Modified Rankin (Stroke Patients Only) Pre-Morbid Rankin Score: Slight disability Modified Rankin: Moderate disability     Balance Overall balance assessment: Needs assistance Sitting-balance support: Feet supported, No upper extremity supported Sitting balance-Leahy Scale: Good     Standing balance support: No upper extremity supported, During functional activity Standing balance-Leahy Scale: Good                               Pertinent Vitals/Pain Pain Assessment Pain Assessment: Faces Faces Pain Scale: Hurts a little bit Pain Location: L side neck, positioning Pain Descriptors / Indicators: Sore Pain Intervention(s): Limited activity within patient's tolerance, Monitored during session, Repositioned    Home Living Family/patient expects to be discharged to:: Private residence Living Arrangements: Spouse/significant other Available Help at Discharge: Family;Available 24 hours/day Type of Home: Apartment Home Access: Elevator  Home Layout: One level Home Equipment: None      Prior Function Prior Level of Function : Independent/Modified Independent;Driving                      Hand Dominance   Dominant Hand: Right    Extremity/Trunk Assessment   Upper Extremity Assessment Upper Extremity Assessment: Defer to OT evaluation    Lower Extremity Assessment Lower Extremity Assessment: LLE deficits/detail LLE Deficits / Details: 3+/5 hip flexion and 4/5 knee extension, otherwise 5/5 LLE Sensation: decreased light touch    Cervical / Trunk Assessment Cervical / Trunk Assessment: Normal  Communication   Communication: No difficulties  Cognition Arousal/Alertness: Awake/alert Behavior During Therapy: WFL for tasks assessed/performed Overall Cognitive Status: Within Functional Limits for tasks assessed                                          General Comments      Exercises     Assessment/Plan    PT Assessment Patient needs continued PT services  PT Problem List Decreased balance;Decreased mobility;Impaired sensation;Decreased strength       PT Treatment Interventions Gait training;Functional mobility training;Therapeutic activities;Therapeutic exercise;Balance training;Cognitive remediation;Patient/family education;Stair training    PT Goals (Current goals can be found in the Care Plan section)  Acute Rehab PT Goals Patient Stated Goal: home PT Goal Formulation: With patient Time For Goal Achievement: 02/12/22 Potential to Achieve Goals: Good    Frequency Min 4X/week     Co-evaluation               AM-PAC PT "6 Clicks" Mobility  Outcome Measure Help needed turning from your back to your side while in a flat bed without using bedrails?: None Help needed moving from lying on your back to sitting on the side of a flat bed without using bedrails?: None Help needed moving to and from a bed to a chair (including a wheelchair)?: A Little Help needed standing up from a chair using your arms (e.g., wheelchair or bedside chair)?: A Little Help needed to walk in hospital room?: A Little Help needed climbing  3-5 steps with a railing? : A Little 6 Click Score: 20    End of Session   Activity Tolerance: Patient tolerated treatment well Patient left: in bed;with call bell/phone within reach (in ED on stretcher) Nurse Communication: Mobility status PT Visit Diagnosis: Other abnormalities of gait and mobility (R26.89);Other symptoms and signs involving the nervous system (R29.898)    Time: 8182-9937 PT Time Calculation (min) (ACUTE ONLY): 14 min   Charges:   PT Evaluation $PT Eval Low Complexity: 1 Low         Gavin [name] S, PT DPT Acute Rehabilitation Services Pager [phone]  Office [phone]  Jaymir Struble E Christain [place] 01/29/2022, 9:09 AM

## 2022-01-29 NOTE — Progress Notes (Signed)
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  Allergies  Allergen Reactions   Altace [Ramipril] Anaphylaxis   Cortizone-10 [Hydrocortisone] Other (See Comments)    Vision problems- affected ability to see far away    Patient Measurements: Height: 5\' 5"  (165.1 cm) Weight: 76.4 kg (168 lb 6.9 oz) IBW/kg (Calculated) : 61.5  Vital Signs: Temp: 97.6 F (36.4 C) (11/29 1547) Temp Source: Oral (11/29 1547) BP: 163/78 (11/29 1547) Pulse Rate: 95 (11/29 1547)  Labs: Recent Labs    01/28/22 1210 01/28/22 1211 01/28/22 1335 01/29/22 0247 01/29/22 1539  HGB 14.4 15.0  --  13.5  --   HCT 44.3 44.0  --  40.9  --   PLT 185  --   --  158  --   APTT 26  --   --   --   --   LABPROT 13.4  --   --   --   --   INR 1.0  --   --   --   --   HEPARINUNFRC  --   --   --   --  0.12*  CREATININE  --  1.00 1.08 1.11  --      Estimated Creatinine Clearance: 54.1 mL/min (by C-G formula based on SCr of 1.11 mg/dL).   Medical History: Past Medical History:  Diagnosis Date   Diabetes mellitus without complication (HCC)    HLD (hyperlipidemia)    Shingles    TIA (transient ischemic attack) 12/16/2011    Assessment: 76yo male was admitted 11/25-27 for acute CVA, received TNKase and discharged on DAPT, now c/o recurrent numbness of left side >> repeat MRI reveals multiple new small acute infarcts, now awaiting right transcarotid artery stenting >> to begin heparin to prevent further strokes prior to vascular procedure.  Heparin level came back subtherapeutic this PM. We will increase rate and check in 8 hr. Goal of Therapy:  Heparin level 0.3-0.5 units/ml Monitor platelets by anticoagulation protocol: Yes   Plan:  Increase heparin infusion to 1050 units/hr. Check 8 hr HL Monitor heparin levels and CBC.  08-27-1979, PharmD, BCIDP, AAHIVP,  CPP Infectious Disease Pharmacist 01/29/2022 4:13 PM

## 2022-01-29 NOTE — ED Notes (Signed)
Walked patient to the bathroom patient did well 

## 2022-01-29 NOTE — Evaluation (Signed)
Occupational Therapy Evaluation Patient Details Name: Gavin [name] MRN: [id number] DOB: June 27, 1945 Today's Date: 01/29/2022   History of Present Illness 76 yo male presents to [facility] on 11/28 with worsening L sided numbness, imbalance. Pt initially admitted 11/25 for R thalamic infarct and R frontal punctate infarct, received TNK. [facility] 11/28 without new abnormality just demonstrates findings consistent with previous MRI; MRI 11/28  shows multiple new small acute infarcts in the right frontal and parietal lobes. Plan for R transcarotid artery stenting 12/1. PMH - DM, shingles, HTN, HLD.   Clinical Impression   Patient admitted for the diagnosis above.  PTA he resides in [place] with his spouse, who is able to assist as needed.  Patient was admitted last Saturday with a CVA, and now has had a second event.  Patient states that other than a little difficulty focusing/vision, and L leg weakness, he is essentially at his baseline.  No acute OT is indicated, he is scheduled for a stenting procedure this week, and OT encouraged mobility with staff as tolerated.  No post acute OT is anticipated.        Recommendations for follow up therapy are one component of a multi-disciplinary discharge planning process, led by the attending physician.  Recommendations may be updated based on patient status, additional functional criteria and insurance authorization.   Follow Up Recommendations  No OT follow up     Assistance Recommended at Discharge PRN  Patient can return home with the following Assist for transportation    Functional Status Assessment  Patient has not had a recent decline in their functional status  Equipment Recommendations  None recommended by OT    Recommendations for Other Services       Precautions / Restrictions Precautions Precautions: Fall Restrictions Weight Bearing Restrictions: No      Mobility Bed Mobility Overal bed mobility: Needs Assistance Bed Mobility: Supine to  Sit, Sit to Supine     Supine to sit: Modified independent (Device/Increase time)          Transfers                          Balance Overall balance assessment: Mild deficits observed, not formally tested                                         ADL either performed or assessed with clinical judgement   ADL Overall ADL's : At baseline                                             Vision Patient Visual Report: Blurring of vision Vision Assessment?: No apparent visual deficits Additional Comments: baseline blurred vision from cataract surgery.     Perception     Praxis      Pertinent Vitals/Pain Pain Assessment Pain Assessment: No/denies pain Pain Intervention(s): Monitored during session     Hand Dominance Right   Extremity/Trunk Assessment Upper Extremity Assessment Upper Extremity Assessment: Overall WFL for tasks assessed   Lower Extremity Assessment Lower Extremity Assessment: Defer to PT evaluation LLE Deficits / Details: 3+/5 hip flexion and 4/5 knee extension, otherwise 5/5 LLE Sensation: decreased light touch   Cervical / Trunk Assessment Cervical / Trunk Assessment: Normal   Communication Communication  Communication: No difficulties   Cognition Arousal/Alertness: Awake/alert Behavior During Therapy: WFL for tasks assessed/performed Overall Cognitive Status: Within Functional Limits for tasks assessed                                       General Comments   VSS on RA    Exercises     Shoulder Instructions      Home Living Family/patient expects to be discharged to:: Private residence Living Arrangements: Spouse/significant other Available Help at Discharge: Family;Available 24 hours/day Type of Home: Apartment Home Access: Elevator     Home Layout: One level     Bathroom Shower/Tub: Producer, television/film/video: Standard     Home Equipment: None      Lives  With: Spouse    Prior Functioning/Environment Prior Level of Function : Independent/Modified Independent;Driving                        OT Problem List: Impaired balance (sitting and/or standing)      OT Treatment/Interventions:      OT Goals(Current goals can be found in the care plan section) Acute Rehab OT Goals Patient Stated Goal: Return home to [facility] OT Goal Formulation: With patient Time For Goal Achievement: 02/03/22 Potential to Achieve Goals: Good  OT Frequency:      Co-evaluation              AM-PAC OT "6 Clicks" Daily Activity     Outcome Measure Help from another person eating meals?: None Help from another person taking care of personal grooming?: None Help from another person toileting, which includes using toliet, bedpan, or urinal?: None Help from another person bathing (including washing, rinsing, drying)?: None Help from another person to put on and taking off regular upper body clothing?: None Help from another person to put on and taking off regular lower body clothing?: None 6 Click Score: 24   End of Session Nurse Communication: Mobility status  Activity Tolerance: Patient tolerated treatment well Patient left: in bed;with call bell/phone within reach;with family/visitor present  OT Visit Diagnosis: Other abnormalities of gait and mobility (R26.89)                Time: 1572-6203 OT Time Calculation (min): 17 min Charges:  OT General Charges $OT Visit: 1 Visit OT Evaluation $OT Eval Moderate Complexity: 1 Mod  01/29/2022  RP, OTR/L  Acute Rehabilitation Services  Office:  [phone]   Suzanna Obey 01/29/2022, 10:30 AM

## 2022-01-29 NOTE — Progress Notes (Signed)
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  Allergies  Allergen Reactions   Altace [Ramipril] Anaphylaxis   Cortizone-10 [Hydrocortisone] Other (See Comments)    Vision problems- affected ability to see far away    Patient Measurements: Height: 5\' 5"  (165.1 cm) Weight: 76.4 kg (168 lb 6.9 oz) IBW/kg (Calculated) : 61.5  Vital Signs: Temp: 98.6 F (37 C) (11/29 0535) Temp Source: Oral (11/29 0535) BP: 129/74 (11/29 0400) Pulse Rate: 74 (11/29 0400)  Labs: Recent Labs    01/28/22 1210 01/28/22 1211 01/28/22 1335 01/29/22 0247  HGB 14.4 15.0  --  13.5  HCT 44.3 44.0  --  40.9  PLT 185  --   --  158  APTT 26  --   --   --   LABPROT 13.4  --   --   --   INR 1.0  --   --   --   CREATININE  --  1.00 1.08 1.11    Estimated Creatinine Clearance: 54.1 mL/min (by C-G formula based on SCr of 1.11 mg/dL).   Medical History: Past Medical History:  Diagnosis Date   Diabetes mellitus without complication (HCC)    HLD (hyperlipidemia)    Shingles    TIA (transient ischemic attack) 12/16/2011    Assessment: 76yo male was admitted 11/25-27 for acute CVA, received TNKase and discharged on DAPT, now c/o recurrent numbness of left side >> repeat MRI reveals multiple new small acute infarcts, now awaiting right transcarotid artery stenting >> to begin heparin to prevent further strokes prior to vascular procedure.  Goal of Therapy:  Heparin level 0.3-0.5 units/ml Monitor platelets by anticoagulation protocol: Yes   Plan:  Start heparin infusion at 900 units/hr. Monitor heparin levels and CBC.  08-27-1979, PharmD, BCPS  01/29/2022,6:27 AM

## 2022-01-29 NOTE — ED Notes (Signed)
Pt resting comfortably, no signs of distress, Personal items, call button within reach, needs nothing ATT.

## 2022-01-29 NOTE — Progress Notes (Addendum)
STROKE TEAM PROGRESS NOTE   INTERVAL HISTORY His wife is at the bedside.  Patient has some complaints about being in the ED but otherwise is feeling stable from presentation. He states he has a mild limp with walking and some residual left lower extremity numbness. He is on a heparin gtt.  Plan for right ICA stent tomorrow.   Vitals:   01/29/22 0850 01/29/22 1000 01/29/22 1044 01/29/22 1100  BP: (!) 124/53 (!) 144/76  (!) 148/97  Pulse: 83 100  89  Resp:    12  Temp: 98.1 F (36.7 C)  98.2 F (36.8 C) 98.5 F (36.9 C)  TempSrc: Oral  Oral Oral  SpO2: 100% 97%  98%  Weight:      Height:       CBC:  Recent Labs  Lab 01/25/22 1354 01/25/22 1405 01/28/22 1210 01/28/22 1211 01/29/22 0247  WBC 9.8  --  10.0  --  10.8*  NEUTROABS 6.0  --  6.8  --   --   HGB 14.1   < > 14.4 15.0 13.5  HCT 43.7   < > 44.3 44.0 40.9  MCV 82.5  --  81.1  --  81.5  PLT 185  --  185  --  158   < > = values in this interval not displayed.   Basic Metabolic Panel:  Recent Labs  Lab 01/28/22 1335 01/29/22 0247  NA 140 137  K 3.9 3.9  CL 105 105  CO2 25 20*  GLUCOSE 178* 243*  BUN 15 17  CREATININE 1.08 1.11  CALCIUM 10.0 9.4   Lipid Panel:  Recent Labs  Lab 01/26/22 0636  CHOL 127  TRIG 42  HDL 41  CHOLHDL 3.1  VLDL 8  LDLCALC 78   HgbA1c:  Recent Labs  Lab 01/26/22 0636  HGBA1C 7.7*   Urine Drug Screen:  Recent Labs  Lab 01/25/22 1359  LABOPIA NONE DETECTED  COCAINSCRNUR NONE DETECTED  LABBENZ NONE DETECTED  AMPHETMU NONE DETECTED  THCU NONE DETECTED  LABBARB NONE DETECTED    Alcohol Level  Recent Labs  Lab 01/28/22 1210  ETH <10   IMAGING past 24 hours MR BRAIN WO CONTRAST  Result Date: 01/28/2022 CLINICAL DATA:  Stroke, follow up. Recent stroke. Recurrent left arm and leg numbness. EXAM: MRI HEAD WITHOUT CONTRAST TECHNIQUE: Multiplanar, multiecho pulse sequences of the brain and surrounding structures were obtained without intravenous contrast. COMPARISON:   Head CT 01/28/2022 and MRI 01/26/2022 FINDINGS: Brain: Compared with the recent prior MRI, there are multiple new subcentimeter acute cortical and subcortical infarcts in the right frontal and right parietal lobes. A recent right thalamic infarct is unchanged in size. No mass, midline shift, or extra-axial fluid collection is identified. Chronic T2 hyperintensities in the cerebral white matter bilaterally are unchanged and nonspecific but compatible with mild chronic small vessel ischemic disease. There is mild cerebral atrophy. A single chronic microhemorrhage is again noted in the right centrum semiovale. Vascular: Major intracranial vascular flow voids are preserved. Skull and upper cervical spine: Unremarkable bone marrow signal. Sinuses/Orbits: Bilateral cataract extraction. Mild mucosal thickening in the paranasal sinuses. Clear mastoid air cells. Other: None. IMPRESSION: 1. Multiple new small acute infarcts in the right frontal and parietal lobes. 2. Unchanged recent right thalamic infarct. 3. Mild chronic small vessel ischemic disease. Electronically Signed   By: Sebastian Ache M.D.   On: 01/28/2022 15:16   MR ANGIO HEAD WO CONTRAST  Result Date: 01/28/2022 CLINICAL DATA:  Stroke, follow  up. Recent stroke. Recurrent left arm and leg numbness. EXAM: MRA HEAD WITHOUT CONTRAST TECHNIQUE: Angiographic images of the Circle of Willis were acquired using MRA technique without intravenous contrast. COMPARISON:  Head and neck CTA 01/25/2022 FINDINGS: Anterior circulation: The internal carotid arteries are widely patent from skull base to carotid termini. ACAs and MCAs are patent without evidence of a proximal branch occlusion or significant A1 or M1 stenosis. Detailed assessment for potential branch vessel stenoses is limited by motion artifact, however no significant stenosis was evident on the recent prior CTA. No aneurysm is identified. Posterior circulation: The included intracranial vertebral arteries are  patent to the basilar. The right PICA and left AICA appear dominant. Patent SCA origins are seen bilaterally. The basilar artery is widely patent. There is a large right posterior communicating artery with hypoplastic or absent right P1 segment. Both PCAs are patent without evidence of a significant proximal stenosis. No aneurysm is identified. Anatomic variants: Fetal right PCA.  Absent right A1 segment. IMPRESSION: No large vessel occlusion or significant proximal stenosis. Electronically Signed   By: Sebastian Ache M.D.   On: 01/28/2022 15:07    PHYSICAL EXAM  Physical Exam  Constitutional: Appears well-developed and well-nourished.  Psych: Affect appropriate to situation Eyes: No scleral injection HENT: No OP obstrucion MSK: no joint deformities.  Cardiovascular: Normal rate and regular rhythm.  Respiratory: Effort normal, non-labored breathing GI: Soft.  No distension. There is no tenderness.  Skin: WDI  Neuro: Mental Status: Patient is awake, alert, oriented to person, place, month, year, and situation. Patient is able to give a clear and coherent history. No signs of aphasia or neglect Cranial Nerves: II: Visual Fields are full. Pupils are equal, round, and reactive to light.   III,IV, VI: EOMI without ptosis or diploplia.  V: Some decreased sensation is reported to the left face compared to the right  VII: Facial movement is symmetric resting and with movement  VIII: Hearing is intact to voice X: Palate elevates symmetrically XI: Shoulder shrug is symmetric. XII: Tongue protrudes midline without atrophy or fasciculations.  Motor: Tone is normal. Bulk is normal. 5/5 strength was present in right upper and lower extremities.  There is minimal left upper extremity weakness with a slight vertical drift on assessment.  There is minimal weakness of the left lower extremity without noticeable vertical drift on assessment.  Sensory: Decreased sensation reported to the left upper and  lower extremities compared to the right. Cerebellar: FNF and HKS are intact bilaterally  ASSESSMENT/PLAN [name] is a 76 y.o. male with history of HTN, HLD, DM2, shingles presenting with worsening left-sided numbness with a previous admission on 01/25/22 for left facial numbness with right thalamic and right frontal small strokes s/p TNK. On 11/28, patient returned to the hospital for worsening left-sided numbness. No TNK due to recent stroke, no LVO.   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  Code Stroke 11/28 focal low-density in the posterolateral right thalamus corresponding to the acute infarction shown on recent MRI. No evidence of progression or hemorrhage. ASPECTS of 10.    MRI brain wo contrast 11/28: Multiple new small acute infarcts in the right frontal and parital lobes, unchanged recent right thalamic infarct, mild chronic small vessel ischemic disease.  MRA head No large vessel occlusion or significant proximal stenosis.  CTA head and neck 11/25: 1. No emergent large vessel occlusion. 2. Atherosclerotic plaque at the carotid bifurcations,  right more than left, without hemodynamically significant stenosis or occlusion.  2D Echo LVEF 50-55% Plan for right ICA stenting Friday 12/1. Given multiple stroke locations, recommend 30-day cardiac event monitoring with PCP/neurology in [place] to rule out atrial fibrillation.  LDL 78 HgbA1c 7.7 VTE prophylaxis - heparin gtt  aspirin 81 mg daily and clopidogrel 75 mg daily prior to admission, now on heparin IV, aspirin 81 and plan 75  Therapy recommendations:  outpatient PT Disposition:  pending   Hypertension Home meds:   losartan 50mg  Stable Long-term BP goal normotensive  Hyperlipidemia Home meds: lipitor 40 mg, resumed in hospital LDL 78, goal < 70 Recently Lipitor increased from 20 mg to 40 mg during last admission 11/25, continue  atorvastatin at 40 mg Continue statin at discharge  Diabetes type II Unontrolled Home meds:  glimepiride, metformin, insulin  HgbA1c 7.7, goal < 7.0 CBGs SSI  Other Stroke Risk Factors Advanced Age >/= 1  History of stroke/TIA  Other Active Problems BPH  Hospital day # 0  -- 76, AGACNP-BC [facility] [phone]  ATTENDING NOTE: I reviewed above note and agree with the assessment and plan. Pt was seen and examined.   Patient readmitted for recurrent right MCA scattered punctate infarcts.  Reviewed again CT head and neck last admission, concerning for unstable plaque at right ICA bulb.  Discussed with Dr. [phone] vascular surgery, recommend continue DAPT and add heparin IV, planning for right TCAR Friday.  Continue statin.  Will follow  For detailed assessment and plan, please refer to above/below as I have made changes wherever appropriate.   Sunday, MD PhD Stroke Neurology 01/29/2022 7:57 PM    To contact Stroke Continuity provider, please refer to 01/31/2022. After hours, contact General Neurology

## 2022-01-29 NOTE — Progress Notes (Signed)
  Progress Note   Patient: Gavin [name] VEH:[id number] DOB: Jan 24, 1946 DOA: 01/28/2022     0 DOS: the patient was seen and examined on 01/29/2022 at 8:30AM      Brief hospital course: [name] is a 76 yo M with DM, HTN, and recent CVA who presented with worsening Left sided numbness.  Admitted 2 days PTA for small thalamic stroke.    After discharge, morning of this repeat admission, noted acute left hemiparesthesias, imbalance, so returned to the ER.    11/25-11/27: Admitted for thalamic stroke, s/p TNK  11/28: Readmitted, MRI shows new scattered acute infarcts in right MCA territory, given mixed plaque in right ICA on recent CTA, it was thought this was a high risk symptomatic carotid plaque, CEA recommended     Assessment and Plan: Cerebrovascular accident Symptomatic carotid stenosis - Consult vascular surgery and neurology - Continue aspirin, Plavix, and atorvastatin - Continue heparin    Diabetes Leukosis controlled - Continue Lantus - Continue sliding scale corrections  Hypertension Recommend permissive hypertension - Hold losartan  Hyperlipidemia -Continue atorvastatin        Subjective: Patient has no complaints, still has some mild weakness.     Physical Exam: BP (!) 163/78 (BP Location: Right Arm)   Pulse 95   Temp 97.6 F (36.4 C) (Oral)   Resp 17   Ht 5\' 5"  (1.651 m)   Wt 76.4 kg   SpO2 99%   BMI 28.03 kg/m   Adult male, sitting on the edge of the bed, interactive and appropriate Mild unilateral weakness, face symmetric, speech fluent, alert and oriented RRR, no murmurs, no peripheral edema Respiratory rate normal, lungs clear without rales or wheezes   Data Reviewed: Basic metabolic CBC reviewed  Family Communication: None present    Disposition: Status is: Inpatient         Author: , MD 01/29/2022 5:51 PM  For on call review [email or web address].

## 2022-01-29 NOTE — Hospital Course (Signed)
Gavin [name] is a 76 yo M with DM, HTN, and recent CVA who presented with worsening Left sided numbness.  Admitted 2 days PTA for small thalamic stroke.    After discharge, morning of this repeat admission, noted acute left hemiparesthesias, imbalance, so returned to the ER.    11/25-11/27: Admitted for thalamic stroke, s/p TNK  11/28: Readmitted, MRI shows new scattered acute infarcts in right MCA territory, given mixed plaque in right ICA on recent CTA, it was thought this was a high risk symptomatic carotid plaque, CEA recommended 11/30: Mild resurgence of facial numbness, right sided numbness

## 2022-01-29 NOTE — Progress Notes (Signed)
  Progress Note    01/29/2022 5:54 AM * No surgery found *  Subjective: Still having some left leg weakness was unable to walk yesterday  Vitals:   01/29/22 0400 01/29/22 0535  BP: 129/74   Pulse: 74   Resp: 13   Temp:  98.6 F (37 C)  SpO2: 97%     Physical Exam: Awake alert and oriented Nonlabored respirations Bilateral upper extremity grip strength is intact and speech is normal  CBC    Component Value Date/Time   WBC 10.8 (H) 01/29/2022 0247   RBC 5.02 01/29/2022 0247   HGB 13.5 01/29/2022 0247   HCT 40.9 01/29/2022 0247   PLT 158 01/29/2022 0247   MCV 81.5 01/29/2022 0247   MCH 26.9 01/29/2022 0247   MCHC 33.0 01/29/2022 0247   RDW 14.5 01/29/2022 0247   LYMPHSABS 2.1 01/28/2022 1210   MONOABS 0.8 01/28/2022 1210   EOSABS 0.2 01/28/2022 1210   BASOSABS 0.1 01/28/2022 1210    BMET    Component Value Date/Time   NA 137 01/29/2022 0247   K 3.9 01/29/2022 0247   CL 105 01/29/2022 0247   CO2 20 (L) 01/29/2022 0247   GLUCOSE 243 (H) 01/29/2022 0247   BUN 17 01/29/2022 0247   CREATININE 1.11 01/29/2022 0247   CREATININE 0.99 12/18/2014 1132   CALCIUM 9.4 01/29/2022 0247   GFRNONAA >60 01/29/2022 0247   GFRAA >90 12/16/2011 1855    INR    Component Value Date/Time   INR 1.0 01/28/2022 1210     Intake/Output Summary (Last 24 hours) at 01/29/2022 0554 Last data filed at 01/29/2022 0535 Gross per 24 hour  Intake 120 ml  Output 600 ml  Net -480 ml     Assessment:  76 y.o. male is here with 2 recent right MCA distribution strokes likely secondary to right ICA lesion.   Plan: Right transcarotid artery stenting on Friday  Physical therapy today  He is on aspirin Plavix and statin and I have initiated heparinization to prevent any further strokes with triple therapy will be short-lived and will discontinue prior to surgery on Friday.  Again discussed the risk benefits and alternatives to surgery and he demonstrates good understanding further  discuss tomorrow.   Shanyah Gattuso C. Randie Heinz, MD [facility] Office: [phone] Pager: [phone]  01/29/2022 5:54 AM

## 2022-01-29 NOTE — Progress Notes (Signed)
Inpatient Diabetes Program Recommendations  AACE/ADA: New Consensus Statement on Inpatient Glycemic Control (2015)  Target Ranges:  Prepandial:   less than 140 mg/dL      Peak postprandial:   less than 180 mg/dL (1-2 hours)      Critically ill patients:  140 - 180 mg/dL   Lab Results  Component Value Date   GLUCAP 270 (H) 01/29/2022   HGBA1C 7.7 (H) 01/26/2022    Review of Glycemic Control  Latest Reference Range & Units 01/28/22 12:07 01/28/22 16:51 01/28/22 21:33 01/29/22 07:52 01/29/22 11:53  Glucose-Capillary 70 - 99 mg/dL 540 (H) 981 (H)  Novolog 3 units 185 (H) 171 (H)  Novolog 3 units 270 (H)  Novolog 8 units    Diabetes history: DM 2 Outpatient Diabetes medications:  Amaryl 1 mg daily, Metformin 850 mg tid with meals, Novolog 16 units in the AM, Toujeo 30 units q HS Current orders for Inpatient glycemic control:  Semglee 20 units qhs Novolog 0-15 units tid + hs   A1c 7.7% on 11/26 Glucose trends increase after meals  Inpatient Diabetes Program Recommendations:    -   Please consider adding Novolog 3-4 units tid meal coverage if eating at least 50% of meals.   Thanks,  Christena Deem RN, MSN, BC-ADM Inpatient Diabetes Coordinator Team Pager [phone] (8a-5p)

## 2022-01-30 DIAGNOSIS — I639 Cerebral infarction, unspecified: Secondary | ICD-10-CM

## 2022-01-30 DIAGNOSIS — I6521 Occlusion and stenosis of right carotid artery: Secondary | ICD-10-CM

## 2022-01-30 DIAGNOSIS — I63331 Cerebral infarction due to thrombosis of right posterior cerebral artery: Secondary | ICD-10-CM | POA: Diagnosis not present

## 2022-01-30 DIAGNOSIS — N400 Enlarged prostate without lower urinary tract symptoms: Secondary | ICD-10-CM | POA: Diagnosis not present

## 2022-01-30 LAB — RESPIRATORY PANEL BY PCR

## 2022-01-30 LAB — CBC
HCT: 41.4 % (ref 39.0–52.0)
Hemoglobin: 13.9 g/dL (ref 13.0–17.0)
MCH: 27.4 pg (ref 26.0–34.0)
MCHC: 33.6 g/dL (ref 30.0–36.0)
MCV: 81.7 fL (ref 80.0–100.0)
Platelets: 155 10*3/uL (ref 150–400)
RBC: 5.07 MIL/uL (ref 4.22–5.81)
RDW: 14.5 % (ref 11.5–15.5)
WBC: 10.1 10*3/uL (ref 4.0–10.5)
nRBC: 0 % (ref 0.0–0.2)

## 2022-01-30 LAB — GLUCOSE, CAPILLARY
Glucose-Capillary: 173 mg/dL — ABNORMAL HIGH (ref 70–99)
Glucose-Capillary: 300 mg/dL — ABNORMAL HIGH (ref 70–99)
Glucose-Capillary: 233 mg/dL — ABNORMAL HIGH (ref 70–99)
Glucose-Capillary: 177 mg/dL — ABNORMAL HIGH (ref 70–99)

## 2022-01-30 LAB — COMPREHENSIVE METABOLIC PANEL
ALT: 18 U/L (ref 0–44)
AST: 19 U/L (ref 15–41)
Albumin: 3.6 g/dL (ref 3.5–5.0)
Alkaline Phosphatase: 53 U/L (ref 38–126)
Anion gap: 10 (ref 5–15)
BUN: 18 mg/dL (ref 8–23)
CO2: 28 mmol/L (ref 22–32)
Calcium: 9.7 mg/dL (ref 8.9–10.3)
Chloride: 102 mmol/L (ref 98–111)
Creatinine, Ser: 1.31 mg/dL — ABNORMAL HIGH (ref 0.61–1.24)
GFR, Estimated: 56 mL/min — ABNORMAL LOW (ref >60)
Glucose, Bld: 178 mg/dL — ABNORMAL HIGH (ref 70–99)
Potassium: 4.2 mmol/L (ref 3.5–5.1)
Sodium: 140 mmol/L (ref 135–145)
Total Bilirubin: 0.7 mg/dL (ref 0.3–1.2)
Total Protein: 6.3 g/dL — ABNORMAL LOW (ref 6.5–8.1)

## 2022-01-30 LAB — RESP PANEL BY RT-PCR (FLU A&B, COVID) ARPGX2
Influenza A by PCR: NEGATIVE
Influenza B by PCR: NEGATIVE
SARS Coronavirus 2 by RT PCR: NEGATIVE

## 2022-01-30 LAB — HEPARIN LEVEL (UNFRACTIONATED)
Heparin Unfractionated: 0.41 IU/mL (ref 0.30–0.70)
Heparin Unfractionated: 0.2 IU/mL — ABNORMAL LOW (ref 0.30–0.70)

## 2022-01-30 MED ORDER — INSULIN ASPART 100 UNIT/ML IJ SOLN
5.0000 [IU] | Freq: Three times a day (TID) | INTRAMUSCULAR | Status: DC
  Administered 2022-01-30 – 2022-02-01 (×3): 5 [IU] via SUBCUTANEOUS

## 2022-01-30 MED ORDER — SODIUM CHLORIDE 0.9 % IV SOLN: INTRAVENOUS | Status: DC

## 2022-01-30 NOTE — Evaluation (Signed)
Speech Language Pathology Evaluation Patient Details Name: Gavin [name] MRN: [id number] DOB: Jan 13, 1946 Today's Date: 01/30/2022 Time: 0786-7544 SLP Time Calculation (min) (ACUTE ONLY): 22 min  Problem List:  Patient Active Problem List   Diagnosis Date Noted   Acute focal neurological deficit 01/28/2022   BPH (benign prostatic hyperplasia) 01/27/2022   History of CVA (cerebrovascular accident) 01/25/2022   Focal seizure (HCC) 12/17/2011   DM (diabetes mellitus) (HCC) 12/16/2011   HLD (hyperlipidemia) 12/16/2011   HTN (hypertension) 12/16/2011   GERD (gastroesophageal reflux disease) 12/16/2011   Past Medical History:  Past Medical History:  Diagnosis Date   Diabetes mellitus without complication (HCC)    HLD (hyperlipidemia)    Shingles    TIA (transient ischemic attack) 12/16/2011   Past Surgical History:  Past Surgical History:  Procedure Laterality Date   NO PAST SURGERIES     HPI:  Gavin [name] is a 76 y.o. male with medical history significant of BPH, diabetes, seizure, GERD, hyperlipidemia, hypertension, CVA presenting with left-sided numbness.     Patient was recently admitted to the hospital on the neurology service after having an acute CVA 2 days ago.  He received TNK and had no residual deficits on discharge except for some perioral numbness.     Today around 7 AM he noted recurrent left arm and left leg numbness that have been gradually increasing.  He also felt generally weak in the legs.  The numbness in the arm seems to be improving but he still feels numbness on the lateral left leg.     He did have some nausea last night and a sore throat which persisted a little this morning and he received NyQuil for this.  He denies fevers, chills, chest pain, shortness breath, abdominal pain, diarrhea.     Of note, patient spends much of the year in [place] and that is where his medical providers are; MRI head on 01/30/22 indicated Multiple new small acute infarcts in the  right frontal and  parietal lobes.  2. Unchanged recent right thalamic infarct.  3. Mild chronic small vessel ischemic disease. SLE (speech/language assessment) generated.   Assessment / Plan / Recommendation Clinical Impression  Pt seen for speech/language/cognitive assessment via [employer] Mental Status Examination (SLUMS) with an overall score obtained of 28/30 which falls within typical range for this assessment (27/30 or higher).  OME revealed slight decreased L facial sensation, but this did not impact his speech intelligibility and/or swallowing per report.  Speech 100% intelligible, although his vocal quality was min hoarse during speaking attempts.  Pt stated this was d/t "sleeping in the ER and it being cold" and denied any vocal changes prior to this occurrence.  Pt with anxiety about upcoming procedure on 01/31/22 impacting his attention during assessment which may contribute to missed items on this evaluation including simple calculation (multiplied and added vs adding only) and word fluency task decreased as pt with min difficulty naming 15 items, and only named 13 in 1 minute.  Due to typical score obtained on this assessment and no evidence of significant cognitive/linguistic errors, no ST recommended at this time.  If symptoms increase, ST can be re-consulted prn. Thank you for this consult.    SLP Assessment  SLP Recommendation/Assessment: Patient does not need any further Speech Language Pathology Services SLP Visit Diagnosis: Cognitive communication deficit (R41.841)    Recommendations for follow up therapy are one component of a multi-disciplinary discharge planning process, led by the attending physician.  Recommendations may be  updated based on patient status, additional functional criteria and insurance authorization.    Follow Up Recommendations  No SLP follow up    Assistance Recommended at Discharge  Intermittent Supervision/Assistance  Functional Status  Assessment Patient has had a recent decline in their functional status and demonstrates the ability to make significant improvements in function in a reasonable and predictable amount of time.  Frequency and Duration  (evaluation only)         SLP Evaluation Cognition  Overall Cognitive Status: Within Functional Limits for tasks assessed Arousal/Alertness: Awake/alert Orientation Level: Oriented X4 Year: 2023 Month: November Day of Week: Correct Attention: Sustained Sustained Attention: Appears intact Memory: Appears intact Awareness: Appears intact Problem Solving: Appears intact Behaviors: Other (comment) (anxious) Safety/Judgment: Appears intact Comments: Pt requested nurse return to ask about bed alarm setting (in regard to him getting up to use bathroom)       Comprehension  Auditory Comprehension Overall Auditory Comprehension: Appears within functional limits for tasks assessed Yes/No Questions: Within Functional Limits Commands: Within Functional Limits Conversation: Complex Interfering Components: Anxiety EffectiveTechniques: Repetition Visual Recognition/Discrimination Discrimination: Within Function Limits Reading Comprehension Reading Status: Within funtional limits    Expression Expression Primary Mode of Expression: Verbal Verbal Expression Overall Verbal Expression: Appears within functional limits for tasks assessed Level of Generative/Spontaneous Verbalization: Conversation Repetition: No impairment Naming: No impairment Pragmatics: No impairment Non-Verbal Means of Communication: Not applicable Written Expression Dominant Hand: Right Written Expression: Within Functional Limits   Oral / Motor  Oral Motor/Sensory Function Overall Oral Motor/Sensory Function: Mild impairment Facial ROM: Within Functional Limits Facial Symmetry: Within Functional Limits Facial Strength: Within Functional Limits Facial Sensation: Reduced left Lingual ROM: Within  Functional Limits Lingual Symmetry: Within Functional Limits Lingual Strength: Within Functional Limits Lingual Sensation: Reduced Velum: Within Functional Limits Mandible: Within Functional Limits Motor Speech Overall Motor Speech: Appears within functional limits for tasks assessed Respiration: Within functional limits Phonation: Hoarse Resonance: Within functional limits Articulation: Within functional limitis Intelligibility: Intelligible Motor Planning: Witnin functional limits Motor Speech Errors: Not applicable            Tressie Stalker, M.S., CCC-SLP 01/30/2022, 1:36 PM

## 2022-01-30 NOTE — Progress Notes (Addendum)
  Progress Note    01/30/2022 8:05 AM * No surgery date entered *  Subjective:  reports no new neurological deficits   Vitals:   01/29/22 2355 01/30/22 0309  BP: 116/83 138/82  Pulse: 83 72  Resp: 17 16  Temp: 98.8 F (37.1 C) 98.7 F (37.1 C)  SpO2: 96% 97%   Physical Exam: Cardiac:  regular Lungs:  non labored Extremities: mild left leg weakness otherwise moving extremities without deficits Neurologic: alert and oriented. Speech coherent  CBC    Component Value Date/Time   WBC 10.1 01/30/2022 0216   RBC 5.07 01/30/2022 0216   HGB 13.9 01/30/2022 0216   HCT 41.4 01/30/2022 0216   PLT 155 01/30/2022 0216   MCV 81.7 01/30/2022 0216   MCH 27.4 01/30/2022 0216   MCHC 33.6 01/30/2022 0216   RDW 14.5 01/30/2022 0216   LYMPHSABS 2.1 01/28/2022 1210   MONOABS 0.8 01/28/2022 1210   EOSABS 0.2 01/28/2022 1210   BASOSABS 0.1 01/28/2022 1210    BMET    Component Value Date/Time   NA 140 01/30/2022 0216   K 4.2 01/30/2022 0216   CL 102 01/30/2022 0216   CO2 28 01/30/2022 0216   GLUCOSE 178 (H) 01/30/2022 0216   BUN 18 01/30/2022 0216   CREATININE 1.31 (H) 01/30/2022 0216   CREATININE 0.99 12/18/2014 1132   CALCIUM 9.7 01/30/2022 0216   GFRNONAA 56 (L) 01/30/2022 0216   GFRAA >90 12/16/2011 1855    INR    Component Value Date/Time   INR 1.0 01/28/2022 1210     Intake/Output Summary (Last 24 hours) at 01/30/2022 0805 Last data filed at 01/30/2022 0400 Gross per 24 hour  Intake 688.18 ml  Output --  Net 688.18 ml     Assessment/Plan:  76 y.o. male with right MCA stroke with right ICA stenosis  Plan is for right TCAR tomorrow in OR with Dr. Randie Heinz Diet is NPO Consent in chart Continue Aspirin, Statin, Plavix   DVT prophylaxis: Heparin gtt   Graceann Congress, PA-C [facility] [phone] 01/30/2022 8:05 AM  I have independently interviewed and examined patient and agree with PA assessment and plan above.  I discussed the  procedural details with the patient as well as the inherent risks and benefits and patient demonstrates good understanding and will plan for right sided transcarotid artery stenting tomorrow in the OR.  Patrece Tallie C. Randie Heinz, MD [facility] Office: [phone] Pager: [phone]

## 2022-01-30 NOTE — TOC Initial Note (Signed)
Transition of Care [facility]) - Initial/Assessment Note    Patient Details  Name: Gavin [name] MRN: [id number] Date of Birth: 03/06/1945  Transition of Care [facility]) CM/SW Contact:    Kermit Balo, RN Phone Number: 01/30/2022, 11:21 AM  Clinical Narrative:                 Pt readmitted with new stroke. The plan is for a TCAR tomorrow.  At last admission pt set up with outpatient therapy through [facility]. Pt did not attend any sessions before being readmitted. CM will resend referral.  No current DME needs.  Pt states the plan remains to stay in  through December and then return to [place] where his PCP is.  TOC following.  Expected Discharge Plan: OP Rehab Barriers to Discharge: Continued Medical Work up   Patient Goals and CMS Choice     Choice offered to / list presented to : Patient  Expected Discharge Plan and Services Expected Discharge Plan: OP Rehab   Discharge Planning Services: CM Consult   Living arrangements for the past 2 months: Single Family Home                                      Prior Living Arrangements/Services Living arrangements for the past 2 months: Single Family Home Lives with:: Spouse Patient language and need for interpreter reviewed:: Yes Do you feel safe going back to the place where you live?: Yes      Need for Family Participation in Patient Care: Yes (Comment) Care giver support system in place?: Yes (comment)   Criminal Activity/Legal Involvement Pertinent to Current Situation/Hospitalization: No - Comment as needed  Activities of Daily Living      Permission Sought/Granted                  Emotional Assessment Appearance:: Appears stated age Attitude/Demeanor/Rapport: Engaged Affect (typically observed): Accepting Orientation: : Oriented to Self, Oriented to Place, Oriented to  Time, Oriented to Situation   Psych Involvement: No (comment)  Admission diagnosis:  Stroke-like symptoms [R29.90] Acute  focal neurological deficit [R29.818] 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 - [place], [place] - [address] [place] [address] Phone: [phone] Fax: [phone]  [id number] [place], [place] - [address] [place] [address] Phone: [phone] Fax: [phone]     Social Determinants of Health (SDOH) Interventions    Readmission Risk Interventions     No data to display

## 2022-01-30 NOTE — Assessment & Plan Note (Signed)
-   Continue Flomax 

## 2022-01-30 NOTE — Assessment & Plan Note (Signed)
Glucose elevated. On glargine 24-26 plus sulfonylurea and mealtime short acting insulin at home.  Only on glargine 20 here with SS corrections. Will be NPO in morning. - Leave glargine as is for now, plan to increase after surgery - Continue SS corrections

## 2022-01-30 NOTE — Progress Notes (Signed)
ANTICOAGULATION CONSULT NOTE  Pharmacy Consult for heparin Indication:  new acute right frontal/parietal lobe infarcts with symptomatic right ICA thrombotic lesion  Allergies  Allergen Reactions   Altace [Ramipril] Anaphylaxis   Cortizone-10 [Hydrocortisone] Other (See Comments)    Vision problems- affected ability to see far away    Patient Measurements: Height: 5\' 5"  (165.1 cm) Weight: 76.4 kg (168 lb 6.9 oz) IBW/kg (Calculated) : 61.5  Vital Signs: Temp: 98.7 F (37.1 C) (11/30 0309) Temp Source: Oral (11/30 0309) BP: 138/82 (11/30 0309) Pulse Rate: 72 (11/30 0309)  Labs: Recent Labs    01/28/22 1210 01/28/22 1210 01/28/22 1211 01/28/22 1335 01/29/22 0247 01/29/22 1539 01/30/22 0216  HGB 14.4  --  15.0  --  13.5  --  13.9  HCT 44.3  --  44.0  --  40.9  --  41.4  PLT 185  --   --   --  158  --  155  APTT 26  --   --   --   --   --   --   LABPROT 13.4  --   --   --   --   --   --   INR 1.0  --   --   --   --   --   --   HEPARINUNFRC  --   --   --   --   --  0.12* 0.41  CREATININE  --    < > 1.00 1.08 1.11  --  1.31*   < > = values in this interval not displayed.     Estimated Creatinine Clearance: 45.8 mL/min (A) (by C-G formula based on SCr of 1.31 mg/dL (H)).   Medical History: Past Medical History:  Diagnosis Date   Diabetes mellitus without complication (HCC)    HLD (hyperlipidemia)    Shingles    TIA (transient ischemic attack) 12/16/2011    Assessment: 76yo male was admitted 11/25-27 for acute CVA, received TNKase and discharged on DAPT, now c/o recurrent numbness of left side >> repeat MRI reveals multiple new small acute infarcts, now awaiting right transcarotid artery stenting >> to begin heparin to prevent further strokes prior to vascular procedure.  11/30 AM update:  Heparin level therapeutic   Goal of Therapy:  Heparin level 0.3-0.5 units/ml Monitor platelets by anticoagulation protocol: Yes   Plan:  Cont heparin 1050 units/hr 1200  heparin level  12/30, PharmD, BCPS Clinical Pharmacist Phone: [phone]

## 2022-01-30 NOTE — Progress Notes (Signed)
Physical Therapy Treatment Patient Details Name: Gavin [name] MRN: [id number] DOB: 06-20-1945 Today's Date: 01/30/2022   History of Present Illness 76 yo male presents to [facility] on 11/28 with worsening L sided numbness, imbalance. Pt initially admitted 11/25 for R thalamic infarct and R frontal punctate infarct, received TNK. [facility] 11/28 without new abnormality just demonstrates findings consistent with previous MRI; MRI 11/28  shows multiple new small acute infarcts in the right frontal and parietal lobes. Plan for R transcarotid artery stenting 12/1. PMH - DM, shingles, HTN, HLD.    PT Comments    Pt greeted supine in bed and agreeable to session with focus on gait without AD and stair negotiation for increased activity tolerance and improved balance. Pt demonstrating increased tolerance for gait with noted instability of LLE however no LOB. Pt able to accept mild balance challenges with head turns ewith pt able to self correct in all instances. Pt able to ascend/descend 4 steps with step-over-step pattern without fault. Current plan remains appropriate to address deficits and maximize functional independence and decrease caregiver burden.    Recommendations for follow up therapy are one component of a multi-disciplinary discharge planning process, led by the attending physician.  Recommendations may be updated based on patient status, additional functional criteria and insurance authorization.  Follow Up Recommendations  Outpatient PT     Assistance Recommended at Discharge Intermittent Supervision/Assistance  Patient can return home with the following Assistance with cooking/housework   Equipment Recommendations  None recommended by PT    Recommendations for Other Services       Precautions / Restrictions Precautions Precautions: Fall Precaution Comments: likes to wear shoes Restrictions Weight Bearing Restrictions: No     Mobility  Bed Mobility Overal bed mobility: Needs  Assistance Bed Mobility: Supine to Sit     Supine to sit: Modified independent (Device/Increase time)     General bed mobility comments: use of bed rail    Transfers Overall transfer level: Needs assistance Equipment used: None Transfers: Sit to/from Stand Sit to Stand: Supervision           General transfer comment: for safety    Ambulation/Gait Ambulation/Gait assistance: Min guard Gait Distance (Feet): 500 Feet Assistive device: None Gait Pattern/deviations: Step-through pattern, Decreased stride length, Knee hyperextension - left, Decreased dorsiflexion - left Gait velocity: WFL     General Gait Details: close guard for safety, min L knee hyperextension with gait but no overt snapping back or buckling   Stairs Stairs: Yes   Stair Management: One rail Right, Alternating pattern, Forwards Number of Stairs: 4 General stair comments: step over step pattern with no LOB   Wheelchair Mobility    Modified Rankin (Stroke Patients Only) Modified Rankin (Stroke Patients Only) Pre-Morbid Rankin Score: Slight disability Modified Rankin: Moderate disability     Balance Overall balance assessment: Mild deficits observed, not formally tested Sitting-balance support: Feet supported, No upper extremity supported Sitting balance-Leahy Scale: Good     Standing balance support: No upper extremity supported, During functional activity Standing balance-Leahy Scale: Good                              Cognition Arousal/Alertness: Awake/alert Behavior During Therapy: WFL for tasks assessed/performed Overall Cognitive Status: Within Functional Limits for tasks assessed Area of Impairment: Attention                   Current Attention Level: Alternating  General Comments: able to display divided attention but seemed what I would call "a little frenzied" with doing these both at same time        Exercises      General Comments         Pertinent Vitals/Pain Pain Assessment Pain Assessment: Faces Faces Pain Scale: Hurts a little bit Pain Location: L knee Pain Descriptors / Indicators: Sore Pain Intervention(s): Monitored during session, Limited activity within patient's tolerance, Repositioned    Home Living                          Prior Function            PT Goals (current goals can now be found in the care plan section) Acute Rehab PT Goals Patient Stated Goal: home PT Goal Formulation: With patient Time For Goal Achievement: 02/12/22 Progress towards PT goals: Progressing toward goals    Frequency    Min 4X/week      PT Plan      Co-evaluation              AM-PAC PT "6 Clicks" Mobility   Outcome Measure  Help needed turning from your back to your side while in a flat bed without using bedrails?: None Help needed moving from lying on your back to sitting on the side of a flat bed without using bedrails?: None Help needed moving to and from a bed to a chair (including a wheelchair)?: A Little Help needed standing up from a chair using your arms (e.g., wheelchair or bedside chair)?: A Little Help needed to walk in hospital room?: A Little Help needed climbing 3-5 steps with a railing? : A Little 6 Click Score: 20    End of Session Equipment Utilized During Treatment: Gait belt Activity Tolerance: Patient tolerated treatment well Patient left: with call bell/phone within reach;in chair;with nursing/sitter in room Nurse Communication: Mobility status PT Visit Diagnosis: Other abnormalities of gait and mobility (R26.89);Other symptoms and signs involving the nervous system (T41.962)     Time: 2297-9892 PT Time Calculation (min) (ACUTE ONLY): 13 min  Charges:  $Therapeutic Exercise: 8-22 mins                     Gavin [name] R. PTA Acute Rehabilitation Services Office: [phone]    Catalina Antigua 01/30/2022, 9:14 AM

## 2022-01-30 NOTE — Progress Notes (Signed)
Mobility Specialist: Progress Note   01/30/22 1626  Mobility  Activity Ambulated independently in hallway  Level of Assistance Independent  Assistive Device None  Distance Ambulated (ft) 550 ft  Activity Response Tolerated well  Mobility Referral Yes  $Mobility charge 1 Mobility   Pt received in the bed and agreeable to mobility. C/o bilateral knee pain, otherwise asymptomatic. Pt back to bed after session with call bell at his side.   Jameka Ivie Mobility Specialist Please contact via SecureChat or Rehab office at [phone]

## 2022-01-30 NOTE — Assessment & Plan Note (Signed)
Allow permissive HTN - Hold losartan

## 2022-01-30 NOTE — Assessment & Plan Note (Signed)
Continue atorvastatin

## 2022-01-30 NOTE — Assessment & Plan Note (Signed)
-   Consult vascular surgery and neurology - Continue aspirin, Plavix, and atorvastatin - Continue heparin

## 2022-01-30 NOTE — Progress Notes (Signed)
STROKE TEAM PROGRESS NOTE   INTERVAL HISTORY His wife is at the bedside.  Patient has some complaints about worsening left face, left hand and left thigh numbness. Also became hoarseness today, complaining of being cold in ED whole day yesterday.    Vitals:   01/30/22 0309 01/30/22 0829 01/30/22 1120 01/30/22 1311  BP: 138/82 (!) 148/80 (!) 152/79 (!) 157/84  Pulse: 72 88 89   Resp: 16 18 18    Temp: 98.7 F (37.1 C) 98.7 F (37.1 C) 98.9 F (37.2 C)   TempSrc: Oral Oral Oral   SpO2: 97% 97% 97%   Weight:      Height:       CBC:  Recent Labs  Lab 01/25/22 1354 01/25/22 1405 01/28/22 1210 01/28/22 1211 01/29/22 0247 01/30/22 0216  WBC 9.8  --  10.0  --  10.8* 10.1  NEUTROABS 6.0  --  6.8  --   --   --   HGB 14.1   < > 14.4   < > 13.5 13.9  HCT 43.7   < > 44.3   < > 40.9 41.4  MCV 82.5  --  81.1  --  81.5 81.7  PLT 185  --  185  --  158 155   < > = values in this interval not displayed.   Basic Metabolic Panel:  Recent Labs  Lab 01/29/22 0247 01/30/22 0216  NA 137 140  K 3.9 4.2  CL 105 102  CO2 20* 28  GLUCOSE 243* 178*  BUN 17 18  CREATININE 1.11 1.31*  CALCIUM 9.4 9.7   Lipid Panel:  Recent Labs  Lab 01/26/22 0636  CHOL 127  TRIG 42  HDL 41  CHOLHDL 3.1  VLDL 8  LDLCALC 78   HgbA1c:  Recent Labs  Lab 01/26/22 0636  HGBA1C 7.7*   Urine Drug Screen:  Recent Labs  Lab 01/25/22 1359  LABOPIA NONE DETECTED  COCAINSCRNUR NONE DETECTED  LABBENZ NONE DETECTED  AMPHETMU NONE DETECTED  THCU NONE DETECTED  LABBARB NONE DETECTED    Alcohol Level  Recent Labs  Lab 01/28/22 1210  ETH <10   IMAGING past 24 hours No results found.  PHYSICAL EXAM  Physical Exam  Constitutional: Appears well-developed and well-nourished.  Psych: Affect appropriate to situation Eyes: No scleral injection HENT: No OP obstrucion MSK: no joint deformities.  Cardiovascular: Normal rate and regular rhythm.  Respiratory: Effort normal, non-labored breathing GI:  Soft.  No distension. There is no tenderness.  Skin: WDI  Neuro: Mental Status: Patient is awake, alert, oriented to person, place, month, year, and situation. Patient is able to give a clear and coherent history. No signs of aphasia or neglect Cranial Nerves: II: Visual Fields are full. Pupils are equal, round, and reactive to light.   III,IV, VI: EOMI without ptosis or diploplia.  V: Some decreased sensation is reported to the left face compared to the right  VII: Facial movement is symmetric resting and with movement  VIII: Hearing is intact to voice X: Palate elevates symmetrically XI: Shoulder shrug is symmetric. XII: Tongue protrudes midline without atrophy or fasciculations.  Motor: Tone is normal. Bulk is normal. 5/5 strength was present in right upper and lower extremities.  There is minimal left upper extremity weakness with a slight vertical drift on assessment.  There is minimal weakness of the left lower extremity without noticeable vertical drift on assessment.  Sensory: Decreased sensation reported to the left upper and lower extremities compared to the  right. Cerebellar: FNF and HKS are intact bilaterally  ASSESSMENT/PLAN [name] is a 76 y.o. male with history of HTN, HLD, DM2, shingles presenting with worsening left-sided numbness with a previous admission on 01/25/22 for left facial numbness with right thalamic and right frontal small strokes s/p TNK. On 11/28, patient returned to the hospital for worsening left-sided numbness. No TNK due to recent stroke, no LVO.   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  Code Stroke 11/28 focal low-density in the posterolateral right thalamus corresponding to the acute infarction shown on recent MRI. No evidence of progression or hemorrhage. ASPECTS of 10.    MRI brain wo contrast 11/28: Multiple new small acute infarcts in the right frontal  and parital lobes, unchanged recent right thalamic infarct, mild chronic small vessel ischemic disease.  MRA head No large vessel occlusion or significant proximal stenosis.  CTA head and neck 11/25: 1. No emergent large vessel occlusion. 2. Atherosclerotic plaque at the carotid bifurcations, right more than left, without hemodynamically significant stenosis or occlusion.  2D Echo LVEF 50-55% Plan for right ICA stenting tomorrow Given multiple stroke locations, recommend 30-day cardiac event monitoring with PCP/neurology in [place] to rule out atrial fibrillation.  LDL 78 HgbA1c 7.7 VTE prophylaxis - heparin gtt  aspirin 81 mg daily and clopidogrel 75 mg daily prior to admission, now on heparin IV, aspirin 81 and plan 75  Therapy recommendations:  outpatient PT Disposition:  pending   Fluctuation of left hemiparesthesia  Mild fluctuation Too mild, felt not warranted for imaging repeat OK to continue triple therapy at this time  Hypertension Home meds:   losartan 50mg  Stable Long-term BP goal normotensive  Hyperlipidemia Home meds: lipitor 40 mg, resumed in hospital LDL 78, goal < 70 Recently Lipitor increased from 20 mg to 40 mg during last admission 11/25, continue atorvastatin at 40 mg Continue statin at discharge  Diabetes type II Unontrolled Home meds:  glimepiride, metformin, insulin  HgbA1c 7.7, goal < 7.0 CBGs SSI  Other Stroke Risk Factors Advanced Age >/= 68  History of stroke/TIA  Other Active Problems BPH Hoarseness - sore throat - ? Common cold - respiratory panel negative   Hospital day # 1    76, MD PhD Stroke Neurology 01/30/2022 7:38 PM    To contact Stroke Continuity provider, please refer to 02/01/2022. After hours, contact General Neurology

## 2022-01-30 NOTE — Progress Notes (Signed)
ANTICOAGULATION CONSULT NOTE  Pharmacy Consult for heparin Indication:  new acute right frontal/parietal lobe infarcts with symptomatic right ICA thrombotic lesion  Allergies  Allergen Reactions   Altace [Ramipril] Anaphylaxis   Cortizone-10 [Hydrocortisone] Other (See Comments)    Vision problems- affected ability to see far away    Patient Measurements: Height: 5\' 5"  (165.1 cm) Weight: 76.4 kg (168 lb 6.9 oz) IBW/kg (Calculated) : 61.5  Vital Signs: Temp: 98.9 F (37.2 C) (11/30 1120) Temp Source: Oral (11/30 1120) BP: 157/84 (11/30 1311) Pulse Rate: 89 (11/30 1120)  Labs: Recent Labs    01/28/22 1210 01/28/22 1210 01/28/22 1211 01/28/22 1335 01/29/22 0247 01/29/22 1539 01/30/22 0216 01/30/22 1210  HGB 14.4  --  15.0  --  13.5  --  13.9  --   HCT 44.3  --  44.0  --  40.9  --  41.4  --   PLT 185  --   --   --  158  --  155  --   APTT 26  --   --   --   --   --   --   --   LABPROT 13.4  --   --   --   --   --   --   --   INR 1.0  --   --   --   --   --   --   --   HEPARINUNFRC  --   --   --   --   --  0.12* 0.41 0.20*  CREATININE  --    < > 1.00 1.08 1.11  --  1.31*  --    < > = values in this interval not displayed.     Estimated Creatinine Clearance: 45.8 mL/min (A) (by C-G formula based on SCr of 1.31 mg/dL (H)).   Medical History: Past Medical History:  Diagnosis Date   Diabetes mellitus without complication (HCC)    HLD (hyperlipidemia)    Shingles    TIA (transient ischemic attack) 12/16/2011    Assessment: 76yo male was admitted 11/25-27 for acute CVA, received TNKase and discharged on DAPT, now c/o recurrent numbness of left side >> repeat MRI reveals multiple new small acute infarcts, now awaiting right transcarotid artery stenting >> to begin heparin to prevent further strokes prior to vascular procedure.  Heparin level slightly subtherapeutic at 0.2. No issues with heparin running or bleeding noted per RN. Plan R side transcarotid artery  stenting 12/1 - d/w VVS and heparin drip will be held when patient transported to OR.     Goal of Therapy:  Heparin level 0.3-0.5 units/ml Monitor platelets by anticoagulation protocol: Yes   Plan:  Increase heparin drip to 1150 units/hr Check heparin level in 8 hours Daily heparin level and CBC  14/1, PharmD, BCPS 01/30/2022 2:14 PM

## 2022-01-30 NOTE — Assessment & Plan Note (Signed)
Continue PPI ?

## 2022-01-30 NOTE — Progress Notes (Signed)
  Progress Note   Patient: Javien Tesch WVP:[id number] DOB: 1945/07/10 DOA: 01/28/2022     1 DOS: the patient was seen and examined on 01/30/2022 at 9:43AM      Brief hospital course: Mr. Marling is a 76 yo M with DM, HTN, and recent CVA who presented with worsening Left sided numbness.  Admitted 2 days PTA for small thalamic stroke.    After discharge, morning of this repeat admission, noted acute left hemiparesthesias, imbalance, so returned to the ER.    11/25-11/27: Admitted for thalamic stroke, s/p TNK  11/28: Readmitted, MRI shows new scattered acute infarcts in right MCA territory, given mixed plaque in right ICA on recent CTA, it was thought this was a high risk symptomatic carotid plaque, CEA recommended 11/30: Mild resurgence of facial numbness, right sided numbness     Assessment and Plan: * Acute ischemic stroke (HCC)    Symptomatic carotid artery stenosis, right - Consult vascular surgery and neurology - Continue aspirin, Plavix, and atorvastatin - Continue heparin  BPH (benign prostatic hyperplasia) - Continue Flomax  GERD (gastroesophageal reflux disease) - Continue PPI  HTN (hypertension) Allow permissive HTN - Hold losartan  HLD (hyperlipidemia) - Continue atorvastatin  DM (diabetes mellitus) (HCC) Glucose elevated. On glargine 24-26 plus sulfonylurea and mealtime short acting insulin at home.  Only on glargine 20 here with SS corrections. Will be NPO in morning. - Leave glargine as is for now, plan to increase after surgery - Continue SS corrections            Subjective: Having some mild increase in left facial numbness, hand numbess and leg paresthesias today.  No other changes.  No fever.  Does have a sore throat and cough.     Physical Exam: BP (!) 157/84   Pulse 89   Temp 98.9 F (37.2 C) (Oral)   Resp 18   Ht 5\' 5"  (1.651 m)   Wt 76.4 kg   SpO2 97%   BMI 28.03 kg/m   Adult male, no acute distress, interactive and  appropriate RRR, no murmurs, no peripheral edema Respiratory rate normal, lungs clear without rales or wheezes Abdomen soft no tenderness palpation or guarding, no ascites or distention Attention normal, affect normal, judgment is normal, strength appears symmetric in upper and lower extremities bilaterally, speech fluent, face symmetric. Reevaluation at the time of increase in his left-sided numbness symptoms, his neurological exam appears unchanged.    Data Reviewed: Discussed wityh Neurology Patient metabolic panel shows creatinine stable at 1.3 CBC normal Glucose normal COVID and RVP negative.   Family Communication: Wife at bedside    Disposition: Status is: Inpatient         Author: , MD 01/30/2022 3:47 PM  For on call review [email or web address].

## 2022-01-31 ENCOUNTER — Encounter (HOSPITAL_COMMUNITY)
Admission: EM | Disposition: A | Discharge status: Home / Self Care | Payer: Self-pay | Source: Home / Self Care | Attending: Family Medicine

## 2022-01-31 ENCOUNTER — Other Ambulatory Visit: Payer: Self-pay

## 2022-01-31 ENCOUNTER — Inpatient Hospital Stay (HOSPITAL_COMMUNITY): Payer: Medicare (Managed Care) | Admitting: Certified Registered Nurse Anesthetist

## 2022-01-31 ENCOUNTER — Inpatient Hospital Stay (HOSPITAL_COMMUNITY): Payer: Medicare (Managed Care)

## 2022-01-31 ENCOUNTER — Encounter (HOSPITAL_COMMUNITY): Payer: Self-pay | Admitting: Internal Medicine

## 2022-01-31 DIAGNOSIS — Z87891 Personal history of nicotine dependence: Secondary | ICD-10-CM

## 2022-01-31 DIAGNOSIS — I6521 Occlusion and stenosis of right carotid artery: Secondary | ICD-10-CM

## 2022-01-31 DIAGNOSIS — I10 Essential (primary) hypertension: Secondary | ICD-10-CM

## 2022-01-31 DIAGNOSIS — E119 Type 2 diabetes mellitus without complications: Secondary | ICD-10-CM

## 2022-01-31 HISTORY — PX: TRANSCAROTID ARTERY REVASCULARIZATIONÂ: SHX6778

## 2022-01-31 LAB — CBC
HCT: 38.4 % — ABNORMAL LOW (ref 39.0–52.0)
Hemoglobin: 12.8 g/dL — ABNORMAL LOW (ref 13.0–17.0)
MCH: 26.8 pg (ref 26.0–34.0)
MCHC: 33.3 g/dL (ref 30.0–36.0)
MCV: 80.3 fL (ref 80.0–100.0)
Platelets: 142 10*3/uL — ABNORMAL LOW (ref 150–400)
RBC: 4.78 MIL/uL (ref 4.22–5.81)
RDW: 14.4 % (ref 11.5–15.5)
WBC: 7.7 10*3/uL (ref 4.0–10.5)
nRBC: 0 % (ref 0.0–0.2)

## 2022-01-31 LAB — POCT ACTIVATED CLOTTING TIME: Activated Clotting Time: 288 seconds

## 2022-01-31 LAB — ABO/RH: ABO/RH(D): O POS

## 2022-01-31 LAB — GLUCOSE, CAPILLARY
Glucose-Capillary: 151 mg/dL — ABNORMAL HIGH (ref 70–99)
Glucose-Capillary: 136 mg/dL — ABNORMAL HIGH (ref 70–99)
Glucose-Capillary: 111 mg/dL — ABNORMAL HIGH (ref 70–99)
Glucose-Capillary: 120 mg/dL — ABNORMAL HIGH (ref 70–99)
Glucose-Capillary: 122 mg/dL — ABNORMAL HIGH (ref 70–99)

## 2022-01-31 LAB — TYPE AND SCREEN
ABO/RH(D): O POS
Antibody Screen: NEGATIVE

## 2022-01-31 LAB — HEPARIN LEVEL (UNFRACTIONATED)
Heparin Unfractionated: 0.54 IU/mL (ref 0.30–0.70)
Heparin Unfractionated: 0.55 IU/mL (ref 0.30–0.70)

## 2022-01-31 SURGERY — TRANSCAROTID ARTERY REVASCULARIZATION (TCAR)
Anesthesia: General | Site: Neck | Laterality: Right

## 2022-01-31 MED ORDER — HEMOSTATIC AGENTS (NO CHARGE) OPTIME
TOPICAL | Status: DC | PRN
  Administered 2022-01-31: 1 via TOPICAL

## 2022-01-31 MED ORDER — ROCURONIUM BROMIDE 10 MG/ML (PF) SYRINGE
PREFILLED_SYRINGE | INTRAVENOUS | Status: DC | PRN
  Administered 2022-01-31: 50 mg via INTRAVENOUS

## 2022-01-31 MED ORDER — LACTATED RINGERS IV SOLN: INTRAVENOUS | Status: DC

## 2022-01-31 MED ORDER — SODIUM CHLORIDE 0.9 % IV SOLN: INTRAVENOUS | Status: DC

## 2022-01-31 MED ORDER — LABETALOL HCL 5 MG/ML IV SOLN: 10.0000 mg | INTRAVENOUS | Status: DC | PRN

## 2022-01-31 MED ORDER — PHENYLEPHRINE 80 MCG/ML (10ML) SYRINGE FOR IV PUSH (FOR BLOOD PRESSURE SUPPORT)
PREFILLED_SYRINGE | INTRAVENOUS | Status: DC | PRN
  Administered 2022-01-31: 80 ug via INTRAVENOUS

## 2022-01-31 MED ORDER — MAGNESIUM SULFATE 2 GM/50ML IV SOLN: 2.0000 g | Freq: Every day | INTRAVENOUS | Status: DC | PRN

## 2022-01-31 MED ORDER — GLYCOPYRROLATE PF 0.2 MG/ML IJ SOSY
PREFILLED_SYRINGE | INTRAMUSCULAR | Status: AC
  Filled 2022-01-31: qty 1

## 2022-01-31 MED ORDER — PROTAMINE SULFATE 10 MG/ML IV SOLN
INTRAVENOUS | Status: DC | PRN
  Administered 2022-01-31: 50 mg via INTRAVENOUS

## 2022-01-31 MED ORDER — DOCUSATE SODIUM 100 MG PO CAPS
100.0000 mg | ORAL_CAPSULE | Freq: Every day | ORAL | Status: DC
  Filled 2022-01-31: qty 1

## 2022-01-31 MED ORDER — SODIUM CHLORIDE 0.9 % IV SOLN: 500.0000 mL | Freq: Once | INTRAVENOUS | Status: DC | PRN

## 2022-01-31 MED ORDER — HEPARIN 6000 UNIT IRRIGATION SOLUTION
Status: DC | PRN
  Administered 2022-01-31: 1

## 2022-01-31 MED ORDER — MORPHINE SULFATE (PF) 2 MG/ML IV SOLN: 2.0000 mg | INTRAVENOUS | Status: DC | PRN

## 2022-01-31 MED ORDER — FENTANYL CITRATE (PF) 250 MCG/5ML IJ SOLN
INTRAMUSCULAR | Status: DC | PRN
  Administered 2022-01-31: 25 ug via INTRAVENOUS
  Administered 2022-01-31: 50 ug via INTRAVENOUS
  Administered 2022-01-31: 100 ug via INTRAVENOUS

## 2022-01-31 MED ORDER — SUGAMMADEX SODIUM 200 MG/2ML IV SOLN
INTRAVENOUS | Status: DC | PRN
  Administered 2022-01-31: 152.8 mg via INTRAVENOUS

## 2022-01-31 MED ORDER — ORAL CARE MOUTH RINSE: 15.0000 mL | Freq: Once | OROMUCOSAL | Status: AC

## 2022-01-31 MED ORDER — HYDRALAZINE HCL 20 MG/ML IJ SOLN: 5.0000 mg | INTRAMUSCULAR | Status: DC | PRN

## 2022-01-31 MED ORDER — POLYETHYLENE GLYCOL 3350 17 G PO PACK: 17.0000 g | PACK | Freq: Every day | ORAL | Status: DC | PRN

## 2022-01-31 MED ORDER — NITROGLYCERIN 0.2 MG/ML ON CALL CATH LAB
INTRAVENOUS | Status: DC | PRN
  Administered 2022-01-31: 10 ug via INTRAVENOUS
  Administered 2022-01-31: 5 ug via INTRAVENOUS

## 2022-01-31 MED ORDER — ALUM & MAG HYDROXIDE-SIMETH 200-200-20 MG/5ML PO SUSP: 15.0000 mL | ORAL | Status: DC | PRN

## 2022-01-31 MED ORDER — LIDOCAINE 2% (20 MG/ML) 5 ML SYRINGE
INTRAMUSCULAR | Status: DC | PRN
  Administered 2022-01-31: 100 mg via INTRAVENOUS

## 2022-01-31 MED ORDER — EPHEDRINE SULFATE-NACL 50-0.9 MG/10ML-% IV SOSY
PREFILLED_SYRINGE | INTRAVENOUS | Status: DC | PRN
  Administered 2022-01-31: 10 mg via INTRAVENOUS
  Administered 2022-01-31: 2.5 mg via INTRAVENOUS

## 2022-01-31 MED ORDER — GUAIFENESIN ER 600 MG PO TB12
600.0000 mg | ORAL_TABLET | Freq: Two times a day (BID) | ORAL | Status: DC | PRN
  Administered 2022-02-01: 600 mg via ORAL
  Filled 2022-01-31: qty 1

## 2022-01-31 MED ORDER — PROPOFOL 500 MG/50ML IV EMUL
INTRAVENOUS | Status: DC | PRN
  Administered 2022-01-31: 50 ug/kg/min via INTRAVENOUS

## 2022-01-31 MED ORDER — CEFAZOLIN SODIUM-DEXTROSE 2-4 GM/100ML-% IV SOLN
2.0000 g | Freq: Once | INTRAVENOUS | Status: AC
  Administered 2022-01-31: 2 g via INTRAVENOUS

## 2022-01-31 MED ORDER — FENTANYL CITRATE (PF) 250 MCG/5ML IJ SOLN
INTRAMUSCULAR | Status: AC
  Filled 2022-01-31: qty 5

## 2022-01-31 MED ORDER — HEPARIN 6000 UNIT IRRIGATION SOLUTION
Status: AC
  Filled 2022-01-31: qty 500

## 2022-01-31 MED ORDER — METOPROLOL TARTRATE 5 MG/5ML IV SOLN: 2.0000 mg | INTRAVENOUS | Status: DC | PRN

## 2022-01-31 MED ORDER — ONDANSETRON HCL 4 MG/2ML IJ SOLN
INTRAMUSCULAR | Status: DC | PRN
  Administered 2022-01-31: 4 mg via INTRAVENOUS

## 2022-01-31 MED ORDER — HYDROMORPHONE HCL 1 MG/ML IJ SOLN
0.2500 mg | INTRAMUSCULAR | Status: DC | PRN
  Administered 2022-01-31 (×2): 0.5 mg via INTRAVENOUS

## 2022-01-31 MED ORDER — POTASSIUM CHLORIDE CRYS ER 20 MEQ PO TBCR: 20.0000 meq | EXTENDED_RELEASE_TABLET | Freq: Every day | ORAL | Status: DC | PRN

## 2022-01-31 MED ORDER — CHLORHEXIDINE GLUCONATE 0.12 % MT SOLN
15.0000 mL | Freq: Once | OROMUCOSAL | Status: AC
  Administered 2022-01-31: 15 mL via OROMUCOSAL

## 2022-01-31 MED ORDER — CEFAZOLIN SODIUM-DEXTROSE 2-4 GM/100ML-% IV SOLN
2.0000 g | Freq: Three times a day (TID) | INTRAVENOUS | Status: AC
  Administered 2022-01-31 – 2022-02-01 (×2): 2 g via INTRAVENOUS
  Filled 2022-01-31 (×2): qty 100

## 2022-01-31 MED ORDER — ONDANSETRON HCL 4 MG/2ML IJ SOLN: 4.0000 mg | Freq: Four times a day (QID) | INTRAMUSCULAR | Status: DC | PRN

## 2022-01-31 MED ORDER — BISACODYL 10 MG RE SUPP: 10.0000 mg | Freq: Every day | RECTAL | Status: DC | PRN

## 2022-01-31 MED ORDER — HEPARIN SODIUM (PORCINE) 1000 UNIT/ML IJ SOLN
INTRAMUSCULAR | Status: AC
  Filled 2022-01-31: qty 10

## 2022-01-31 MED ORDER — PHENYLEPHRINE HCL-NACL 20-0.9 MG/250ML-% IV SOLN
INTRAVENOUS | Status: DC | PRN
  Administered 2022-01-31: 50 ug/min via INTRAVENOUS

## 2022-01-31 MED ORDER — HEPARIN SODIUM (PORCINE) 1000 UNIT/ML IJ SOLN
INTRAMUSCULAR | Status: DC | PRN
  Administered 2022-01-31: 8000 [IU] via INTRAVENOUS

## 2022-01-31 MED ORDER — 0.9 % SODIUM CHLORIDE (POUR BTL) OPTIME
TOPICAL | Status: DC | PRN
  Administered 2022-01-31: 1000 mL

## 2022-01-31 MED ORDER — OXYCODONE-ACETAMINOPHEN 5-325 MG PO TABS: 1.0000 | ORAL_TABLET | ORAL | Status: DC | PRN

## 2022-01-31 MED ORDER — GLYCOPYRROLATE PF 0.2 MG/ML IJ SOSY
PREFILLED_SYRINGE | INTRAMUSCULAR | Status: DC | PRN
  Administered 2022-01-31 (×2): .2 mg via INTRAVENOUS

## 2022-01-31 MED ORDER — PROPOFOL 10 MG/ML IV BOLUS
INTRAVENOUS | Status: DC | PRN
  Administered 2022-01-31: 50 mg via INTRAVENOUS
  Administered 2022-01-31: 20 mg via INTRAVENOUS
  Administered 2022-01-31: 150 mg via INTRAVENOUS

## 2022-01-31 MED ORDER — HYDROMORPHONE HCL 1 MG/ML IJ SOLN
INTRAMUSCULAR | Status: AC
  Filled 2022-01-31: qty 1

## 2022-01-31 MED ORDER — IODIXANOL 320 MG/ML IV SOLN
INTRAVENOUS | Status: DC | PRN
  Administered 2022-01-31: 1 mL
  Administered 2022-01-31: 12 mL

## 2022-01-31 SURGICAL SUPPLY — 63 items
ADH SKN CLS APL DERMABOND .7 (GAUZE/BANDAGES/DRESSINGS) ×1
BAG BANDED W/RUBBER/TAPE 36X54 (MISCELLANEOUS) ×2 IMPLANT
BAG COUNTER SPONGE SURGICOUNT (BAG) ×2 IMPLANT
BAG EQP BAND 135X91 W/RBR TAPE (MISCELLANEOUS) ×1
BAG SPNG CNTER NS LX DISP (BAG) ×1
CANISTER SUCT 3000ML PPV (MISCELLANEOUS) ×2 IMPLANT
CATH BALLN ENROUTE 5X35 (CATHETERS) IMPLANT
CATH ROBINSON RED A/P 18FR (CATHETERS) IMPLANT
CLIP LIGATING EXTRA MED SLVR (CLIP) ×2 IMPLANT
CLIP LIGATING EXTRA SM BLUE (MISCELLANEOUS) ×2 IMPLANT
COVER DOME SNAP 22 D (MISCELLANEOUS) ×2 IMPLANT
COVER PROBE W GEL 5X96 (DRAPES) ×2 IMPLANT
DERMABOND ADVANCED .7 DNX12 (GAUZE/BANDAGES/DRESSINGS) ×2 IMPLANT
DRAPE FEMORAL ANGIO 80X135IN (DRAPES) ×2 IMPLANT
ELECT REM PT RETURN 9FT ADLT (ELECTROSURGICAL) ×1
ELECTRODE REM PT RTRN 9FT ADLT (ELECTROSURGICAL) ×2 IMPLANT
GLOVE BIO SURGEON STRL SZ7.5 (GLOVE) ×2 IMPLANT
GOWN STRL REUS W/ TWL LRG LVL3 (GOWN DISPOSABLE) ×4 IMPLANT
GOWN STRL REUS W/ TWL XL LVL3 (GOWN DISPOSABLE) ×2 IMPLANT
GOWN STRL REUS W/TWL LRG LVL3 (GOWN DISPOSABLE) ×2
GOWN STRL REUS W/TWL XL LVL3 (GOWN DISPOSABLE) ×1
GUIDEWIRE ENROUTE 0.014 (WIRE) ×2 IMPLANT
HEMOSTAT SNOW SURGICEL 2X4 (HEMOSTASIS) IMPLANT
INSERT FOGARTY SM (MISCELLANEOUS) IMPLANT
INTRODUCER KIT GALT 7CM (INTRODUCER) ×1
KIT BASIN OR (CUSTOM PROCEDURE TRAY) ×2 IMPLANT
KIT ENCORE 26 ADVANTAGE (KITS) ×2 IMPLANT
KIT INTRODUCER GALT 7 (INTRODUCER) ×2 IMPLANT
KIT TURNOVER KIT B (KITS) ×2 IMPLANT
NDL HYPO 25GX1X1/2 BEV (NEEDLE) IMPLANT
NEEDLE HYPO 25GX1X1/2 BEV (NEEDLE) IMPLANT
PACK CAROTID (CUSTOM PROCEDURE TRAY) ×2 IMPLANT
POSITIONER HEAD DONUT 9IN (MISCELLANEOUS) ×2 IMPLANT
POWDER SURGICEL 3.0 GRAM (HEMOSTASIS) IMPLANT
PROTECTION STATION PRESSURIZED (MISCELLANEOUS)
SET MICROPUNCTURE 5F STIFF (MISCELLANEOUS) IMPLANT
SHEATH AVANTI 11CM 5FR (SHEATH) IMPLANT
SHUNT CAROTID BYPASS 10 (VASCULAR PRODUCTS) IMPLANT
SHUNT CAROTID BYPASS 12FRX15.5 (VASCULAR PRODUCTS) IMPLANT
STATION PROTECTION PRESSURIZED (MISCELLANEOUS) IMPLANT
STENT TRANSCAROTID SYSTEM 9X40 (Permanent Stent) IMPLANT
STOPCOCK MORSE 400PSI 3WAY (MISCELLANEOUS) IMPLANT
SUT MNCRL AB 4-0 PS2 18 (SUTURE) ×2 IMPLANT
SUT PROLENE 5 0 C 1 24 (SUTURE) ×2 IMPLANT
SUT PROLENE 6 0 BV (SUTURE) IMPLANT
SUT PROLENE 7 0 BV 1 (SUTURE) IMPLANT
SUT SILK 2 0 PERMA HAND 18 BK (SUTURE) ×2 IMPLANT
SUT SILK 2 0 SH CR/8 (SUTURE) ×2 IMPLANT
SUT SILK 3 0 (SUTURE)
SUT SILK 3-0 18XBRD TIE 12 (SUTURE) IMPLANT
SUT VIC AB 3-0 SH 27 (SUTURE) ×1
SUT VIC AB 3-0 SH 27X BRD (SUTURE) ×2 IMPLANT
SYR 10ML LL (SYRINGE) ×6 IMPLANT
SYR 20ML LL LF (SYRINGE) ×2 IMPLANT
SYR CONTROL 10ML LL (SYRINGE) IMPLANT
SYSTEM TRANSCAROTID NEUROPRTCT (MISCELLANEOUS) ×2 IMPLANT
TOWEL GREEN STERILE (TOWEL DISPOSABLE) ×2 IMPLANT
TRANSCAROTID NEUROPROTECT SYS (MISCELLANEOUS) ×1
TUBING ART PRESS 48 MALE/FEM (TUBING) IMPLANT
TUBING CIL FLEX 10 FLL-RA (TUBING) IMPLANT
TUBING EXTENTION W/L.L. (IV SETS) IMPLANT
WATER STERILE IRR 1000ML POUR (IV SOLUTION) ×2 IMPLANT
WIRE BENTSON .035X145CM (WIRE) ×2 IMPLANT

## 2022-01-31 NOTE — Care Management Important Message (Signed)
Important Message  Patient Details  Name: Gavin [name] MRN: [id number] Date of Birth: 06-19-1945   Medicare Important Message Given:  Yes     [name] 01/31/2022, 2:06 PM

## 2022-01-31 NOTE — Progress Notes (Signed)
ANTICOAGULATION CONSULT NOTE  Pharmacy Consult for heparin Indication:  new acute right frontal/parietal lobe infarcts with symptomatic right ICA thrombotic lesion  Allergies  Allergen Reactions   Altace [Ramipril] Anaphylaxis   Cortizone-10 [Hydrocortisone] Other (See Comments)    Vision problems- affected ability to see far away    Patient Measurements: Height: 5\' 5"  (165.1 cm) Weight: 76.4 kg (168 lb 6.9 oz) IBW/kg (Calculated) : 61.5  Vital Signs: Temp: 98.6 F (37 C) (12/01 0350) Temp Source: Oral (12/01 0350) BP: 130/74 (12/01 0350) Pulse Rate: 75 (12/01 0350)  Labs: Recent Labs    01/28/22 1210 01/28/22 1211 01/28/22 1335 01/29/22 0247 01/29/22 1539 01/30/22 0216 01/30/22 1210 01/31/22 0405  HGB 14.4   < >  --  13.5  --  13.9  --  12.8*  HCT 44.3   < >  --  40.9  --  41.4  --  38.4*  PLT 185  --   --  158  --  155  --  142*  APTT 26  --   --   --   --   --   --   --   LABPROT 13.4  --   --   --   --   --   --   --   INR 1.0  --   --   --   --   --   --   --   HEPARINUNFRC  --   --   --   --    < > 0.41 0.20* 0.54  CREATININE  --    < > 1.08 1.11  --  1.31*  --   --    < > = values in this interval not displayed.     Estimated Creatinine Clearance: 45.8 mL/min (A) (by C-G formula based on SCr of 1.31 mg/dL (H)).   Medical History: Past Medical History:  Diagnosis Date   Diabetes mellitus without complication (HCC)    HLD (hyperlipidemia)    Shingles    TIA (transient ischemic attack) 12/16/2011    Assessment: 76yo male was admitted 11/25-27 for acute CVA, received TNKase and discharged on DAPT, now c/o recurrent numbness of left side >> repeat MRI reveals multiple new small acute infarcts, now awaiting right transcarotid artery stenting >> to begin heparin to prevent further strokes prior to vascular procedure.  Heparin level slightly subtherapeutic at 0.2. No issues with heparin running or bleeding noted per RN. Plan R side transcarotid artery  stenting 12/1 - d/w VVS and heparin drip will be held when patient transported to OR.    12/1 AM update:  Heparin level just above goal   Goal of Therapy:  Heparin level 0.3-0.5 units/ml Monitor platelets by anticoagulation protocol: Yes   Plan:  Dec heparin slightly to 1100 units/hr Check heparin level in 8 hours  14/1, PharmD, BCPS Clinical Pharmacist Phone: [phone]

## 2022-01-31 NOTE — Discharge Instructions (Signed)
   [facility]  Discharge Instructions   Carotid Surgery  Please refer to the following instructions for your post-procedure care. Your surgeon or physician assistant will discuss any changes with you.  Activity  You are encouraged to walk as much as you can. You can slowly return to normal activities but must avoid strenuous activity and heavy lifting until your doctor tell you it's okay. Avoid activities such as vacuuming or swinging a golf club. You can drive after one week if you are comfortable and you are no longer taking prescription pain medications. It is normal to feel tired for serval weeks after your surgery. It is also normal to have difficulty with sleep habits, eating, and bowel movements after surgery. These will go away with time.  Bathing/Showering  Shower daily after you go home. Do not soak in a bathtub, hot tub, or swim until the incision heals completely.  Incision Care  Shower every day. Clean your incision with mild soap and water. Pat the area dry with a clean towel. You do not need a bandage unless otherwise instructed. Do not apply any ointments or creams to your incision. You may have skin glue on your incision. Do not peel it off. It will come off on its own in about one week. Your incision may feel thickened and raised for several weeks after your surgery. This is normal and the skin will soften over time.   For Men Only: It's okay to shave around the incision but do not shave the incision itself for 2 weeks. It is common to have numbness under your chin that could last for several months.  Diet  Resume your normal diet. There are no special food restrictions following this procedure. A low fat/low cholesterol diet is recommended for all patients with vascular disease. In order to heal from your surgery, it is CRITICAL to get adequate nutrition. Your body requires vitamins, minerals, and protein. Vegetables are the best source of  vitamins and minerals. Vegetables also provide the perfect balance of protein. Processed food has little nutritional value, so try to avoid this.  Medications  Resume taking all of your medications unless your doctor or physician assistant tells you not to. If your incision is causing pain, you may take over-the- counter pain relievers such as acetaminophen (Tylenol). If you were prescribed a stronger pain medication, please be aware these medications can cause nausea and constipation. Prevent nausea by taking the medication with a snack or meal. Avoid constipation by drinking plenty of fluids and eating foods with a high amount of fiber, such as fruits, vegetables, and grains.   Do not take Tylenol if you are taking prescription pain medications.  Follow Up  Our office will schedule a follow up appointment 2-3 weeks following discharge.  Please call us immediately for any of the following conditions  . Increased pain, redness, drainage (pus) from your incision site. . Fever of 101 degrees or higher. . If you should develop stroke (slurred speech, difficulty swallowing, weakness on one side of your body, loss of vision) you should call 911 and go to the nearest emergency room. .  Reduce your risk of vascular disease:  . Stop smoking. If you would like help call [facility] at 1-800-QUIT-NOW (1-800-784-8669) or [place] at 336-586-4000. . Manage your cholesterol . Maintain a desired weight . Control your diabetes . Keep your blood pressure down .  If you have any questions, please call the office at [phone]. 

## 2022-01-31 NOTE — Transfer of Care (Signed)
Immediate Anesthesia Transfer of Care Note  Patient: Gavin [name]  Procedure(s) Performed: Transcarotid Artery Revascularization (Right: Neck)  Patient Location: PACU  Anesthesia Type:General  Level of Consciousness: awake, alert , and oriented  Airway & Oxygen Therapy: Patient Spontanous Breathing  Post-op Assessment: Report given to RN and Post -op Vital signs reviewed and stable  Post vital signs: Reviewed and stable  Last Vitals:  Vitals Value Taken Time  BP 105/56 01/31/22 1542  Temp 98   Pulse 97 01/31/22 1544  Resp 14 01/31/22 1544  SpO2 92 % 01/31/22 1544  Vitals shown include unvalidated device data.  Last Pain:  Vitals:   01/31/22 1333  TempSrc: Oral  PainSc: 0-No pain      Patients Stated Pain Goal: 0 (01/31/22 1333)  Complications: No notable events documented.

## 2022-01-31 NOTE — Op Note (Signed)
Patient name: Gavin [name] MRN: [id number] DOB: February 25, 1946 Sex: male  01/31/2022 Pre-operative Diagnosis: Symptomatic right ICA stenosis Post-operative diagnosis:  Same Surgeon:  Apolinar Junes C. Randie Heinz, MD Assistant: Doreatha Massed, PA Procedure Performed: 1.  Ultrasound-guided cannulation left common femoral vein for placement of flow reversal sheath 2.  Transcatheter placement of right carotid 9 x 44mm Enroute stent  Indications: 76 year old male with recent history of right hemispheric distribution stroke was placed on aspirin Plavix return home.  Recurrent stroke.  There is not high-grade stenosis of the internal carotid artery on the right by  NASCET criteria he does have approximately 50% stenosis when measured relative to the normal diameter with what appears to be soft plaque likely an embolic source of stroke.  Findings: There was minimal stenosis probably 25% although with known 50% soft plaque.  This was primarily stented and at completion the patient was neurologically intact with 0% residual stenosis.   Procedure:  The patient was identified in the holding area and taken to the operating room where he was placed supine operative table and general anesthesia was induced.  He was sterilely prepped and draped in the right neck and chest in usual fashion as well as the bilateral groins, antibiotics were minister timeout was called.  We began using ultrasound to identify the left common femoral vein this was cannulated micropuncture needle followed by wire and sheath and a Bentson wire was placed and a flow reversal sheath was placed and this will fix the skin with silk suture.  We then made a vertical incision between the 2 of the sternocleidomastoid on the right dissected down to identify the IJ and the vagus nerve these was retracted laterally.  We identified the common carotid artery and placed a vessel loop and umbilical tape around this proximally and patient was fully heparinized and ACT  returned nearly 300.  We then placed a U stitch 5-0 Prolene in the common carotid artery directed laterally and then cannulated the artery with micropuncture needle followed by wire to 3 cm and sheath to 3 cm.  Angiography was performed.  A J-wire was placed at the carotid bifurcation and flow reversal sheath was placed.  This was affixed to the skin in 2 places and flow reversal was initiated.  Additional angiography with right angled views was performed.  After ACT was confirmed nearly 300 blood pressure was systolic of 160 we clamped the carotid artery and confirmed retrograde flow.  We then used a 1 4 wire across the lesion open to the intracranial ICA and then primarily ballooned with a 5 x 35 mm balloon and then stented with 9 x 40 stent.  Completion demonstrated no residual stenosis in 2 views.  We remove the wire and the clamp was removed.  Blood was returned to the right groin.  The sheath was removed from the right carotid and the 5-0 Prolene suture was cinched.  Doppler demonstrated adequate flow in the common carotid artery and 50 mg of protamine was administered.  The sheath was removed from the groin on the left and pressure was held till hemostasis obtained.  We obtain hemostasis in the right neck irrigated the wound and then closed the platysma with Vicryl and the skin with Monocryl.  Dermabond is placed at the skin level.  Patient was awakened from anesthesia and noted to be neurologically intact and transferred to the recovery area in stable condition.  Contrast: 20cc  EBL: 30cc     Libia Fazzini C. Randie Heinz, MD [facility] Office: [phone] Pager: [phone]

## 2022-01-31 NOTE — Anesthesia Postprocedure Evaluation (Signed)
Anesthesia Post Note  Patient: Gavin [name]  Procedure(s) Performed: Transcarotid Artery Revascularization (Right: Neck)     Patient location during evaluation: PACU Anesthesia Type: General Level of consciousness: awake Pain management: pain level controlled Vital Signs Assessment: post-procedure vital signs reviewed and stable Respiratory status: spontaneous breathing Cardiovascular status: stable Postop Assessment: no apparent nausea or vomiting Anesthetic complications: no   No notable events documented.  Last Vitals:  Vitals:   01/31/22 1542 01/31/22 1545  BP: (!) 105/56 (!) 111/55  Pulse: (!) 102 96  Resp: 15 13  Temp: 36.6 C   SpO2: 92% 92%    Last Pain:  Vitals:   01/31/22 1542  TempSrc:   PainSc: 0-No pain                 Fusako Tanabe

## 2022-01-31 NOTE — Anesthesia Procedure Notes (Signed)
Arterial Line Insertion Start/End12/03/2021 2:00 PM, 01/31/2022 2:05 PM Performed by: Dorris Singh, MD, Lonia Mad, CRNA, CRNA  Patient location: Pre-op. Preanesthetic checklist: patient identified, IV checked, site marked, risks and benefits discussed, surgical consent, monitors and equipment checked, pre-op evaluation, timeout performed and anesthesia consent Lidocaine 1% used for infiltration Left, radial was placed Catheter size: 20 G Hand hygiene performed  and maximum sterile barriers used   Attempts: 1 Procedure performed without using ultrasound guided technique. Following insertion, dressing applied and Biopatch. Post procedure assessment: normal and unchanged  Patient tolerated the procedure well with no immediate complications.

## 2022-01-31 NOTE — Plan of Care (Signed)
Reminded pt he was NPO after MN for procedure in the morning. Pt stated that his throat was sore and voice is hoarse but MD is aware. Pt is self care and amb with steady gait.

## 2022-01-31 NOTE — Progress Notes (Addendum)
STROKE TEAM PROGRESS NOTE   INTERVAL HISTORY His wife is at the bedside. Patient states continued left face and hand numbness, but denies numbness to the left leg.  Patient's voice is hoarse and he has an occasional cough. Patient states he was cold in ED all day 11/28.  Vitals:   01/31/22 0350 01/31/22 0749 01/31/22 1135 01/31/22 1333  BP: 130/74 (!) 142/74 (!) 158/86 (!) 145/82  Pulse: 75 82 84   Resp: 16 16 17  (!) 21  Temp: 98.6 F (37 C) 98.1 F (36.7 C) 98.2 F (36.8 C) 98.2 F (36.8 C)  TempSrc: Oral Oral Oral Oral  SpO2: 98% 95% 98% 97%  Weight:      Height:       CBC:  Recent Labs  Lab 01/25/22 1354 01/25/22 1405 01/28/22 1210 01/28/22 1211 01/30/22 0216 01/31/22 0405  WBC 9.8  --  10.0   < > 10.1 7.7  NEUTROABS 6.0  --  6.8  --   --   --   HGB 14.1   < > 14.4   < > 13.9 12.8*  HCT 43.7   < > 44.3   < > 41.4 38.4*  MCV 82.5  --  81.1   < > 81.7 80.3  PLT 185  --  185   < > 155 142*   < > = values in this interval not displayed.    Basic Metabolic Panel:  Recent Labs  Lab 01/29/22 0247 01/30/22 0216  NA 137 140  K 3.9 4.2  CL 105 102  CO2 20* 28  GLUCOSE 243* 178*  BUN 17 18  CREATININE 1.11 1.31*  CALCIUM 9.4 9.7    Lipid Panel:  Recent Labs  Lab 01/26/22 0636  CHOL 127  TRIG 42  HDL 41  CHOLHDL 3.1  VLDL 8  LDLCALC 78    HgbA1c:  Recent Labs  Lab 01/26/22 0636  HGBA1C 7.7*    Urine Drug Screen:  Recent Labs  Lab 01/25/22 1359  LABOPIA NONE DETECTED  COCAINSCRNUR NONE DETECTED  LABBENZ NONE DETECTED  AMPHETMU NONE DETECTED  THCU NONE DETECTED  LABBARB NONE DETECTED     Alcohol Level  Recent Labs  Lab 01/28/22 1210  ETH <10    IMAGING past 24 hours HYBRID OR IMAGING ([facility] ONLY)  Result Date: 01/31/2022 There is no interpretation for this exam.  This order is for images obtained during a surgical procedure.  Please See "Surgeries" Tab for more information regarding the procedure.    PHYSICAL EXAM  Physical Exam   Constitutional: Appears well-developed and well-nourished.  Psych: Affect appropriate to situation Eyes: No scleral injection HENT: No OP obstrucion MSK: no joint deformities.  Cardiovascular: Normal rate and regular rhythm.  Respiratory: Effort normal, non-labored breathing GI: Soft.  No distension. There is no tenderness.  Skin: WDI  Neuro: Mental Status: Patient is awake, alert, oriented to person, place, month, year, and situation. Patient is able to give a clear and coherent history. No signs of aphasia or neglect Cranial Nerves: II: Visual Fields are full. Pupils are equal, round, and reactive to light.   III,IV, VI: EOMI without ptosis or diploplia.  V: Some decreased sensation is reported to the left face compared to the right  VII: Facial movement is symmetric resting and with movement  VIII: Hearing is intact to voice X: Palate elevates symmetrically XI: Shoulder shrug is symmetric. XII: Tongue protrudes midline without atrophy or fasciculations.  Motor: Tone is normal. Bulk is normal.  5/5 strength was present in right upper and lower extremities.  There is minimal left upper extremity weakness with a slight vertical drift on assessment.  There is minimal weakness of the left lower extremity without noticeable vertical drift on assessment.  Sensory: Decreased sensation reported to the left upper and lower extremities compared to the right. Cerebellar: FNF and HKS are intact bilaterally  ASSESSMENT/PLAN [name] is a 76 y.o. male with history of HTN, HLD, DM2, shingles presenting with worsening left-sided numbness with a previous admission on 01/25/22 for left facial numbness with right thalamic and right frontal small strokes s/p TNK. On 11/28, patient returned to the hospital for worsening left-sided numbness. No TNK due to recent stroke, no LVO.   Stroke:  Multiple new small strokes, right frontal and parietal lobes likely secondary to right ICA thrombotic  lesion.  Code Stroke 11/28 focal low-density in the posterolateral right thalamus corresponding to the acute infarction shown on recent MRI. No evidence of progression or hemorrhage. ASPECTS of 10.    MRI brain wo contrast 11/28: Multiple new small acute infarcts in the right frontal and parital lobes, unchanged recent right thalamic infarct, mild chronic small vessel ischemic disease.  MRA head No large vessel occlusion or significant proximal stenosis.  CTA head and neck 11/25: 1. No emergent large vessel occlusion. 2. Atherosclerotic plaque at the carotid bifurcations, right more than left, without hemodynamically significant stenosis or occlusion.  2D Echo LVEF 50-55% Plan for right ICA stenting today 12/1 with Dr. Randie Heinz Given multiple stroke locations, recommend 30-day cardiac event monitoring with PCP/neurology in [place] to rule out atrial fibrillation.  LDL 78 HgbA1c 7.7 VTE prophylaxis - heparin gtt  aspirin 81 mg daily and clopidogrel 75 mg daily prior to admission, now on heparin IV, aspirin 81 and plav 75  Therapy recommendations:  outpatient PT Disposition:  pending   Fluctuation of left hemiparesthesia  Mild fluctuation Too mild, felt not warranted for imaging repeat OK to continue triple therapy at this time  Hypertension Home meds:   losartan 50mg  Stable Long-term BP goal normotensive  Hyperlipidemia Home meds: lipitor 40 mg, resumed in hospital LDL 78, goal < 70 Recently Lipitor increased from 20 mg to 40 mg during last admission 11/25, continue atorvastatin at 40 mg Continue statin at discharge  Diabetes type II Unontrolled Home meds:  glimepiride, metformin, insulin  HgbA1c 7.7, goal < 7.0 CBGs SSI  Other Stroke Risk Factors Advanced Age >/= 82  History of stroke/TIA  Other Active Problems BPH Hoarseness - sore throat - ? Common cold - respiratory panel negative. Will add guaifenesin PRN.   Hospital day # 2  E. 76, DNP, AGACNP-BC [facility] Pager: [phone]  ATTENDING NOTE: I reviewed above note and agree with the assessment and plan. Pt was seen and examined.   No acute event overnight. Pt still has some left sided numbness, no significant change, still on heparin IV and DAPT. Pending right TCAR with Dr. [phone].   For detailed assessment and plan, please refer to above/below as I have made changes wherever appropriate.   Randie Heinz, MD PhD Stroke Neurology 01/31/2022 5:31 PM     To contact Stroke Continuity provider, please refer to 14/03/2021. After hours, contact General Neurology

## 2022-01-31 NOTE — Progress Notes (Addendum)
ANTICOAGULATION CONSULT NOTE  Pharmacy Consult for heparin Indication:  new acute right frontal/parietal lobe infarcts with symptomatic right ICA thrombotic lesion  Allergies  Allergen Reactions   Altace [Ramipril] Anaphylaxis   Cortizone-10 [Hydrocortisone] Other (See Comments)    Vision problems- affected ability to see far away    Patient Measurements: Height: 5\' 5"  (165.1 cm) Weight: 76.4 kg (168 lb 6.9 oz) IBW/kg (Calculated) : 61.5  Vital Signs: Temp: 98.2 F (36.8 C) (12/01 1135) Temp Source: Oral (12/01 1135) BP: 158/86 (12/01 1135) Pulse Rate: 84 (12/01 1135)  Labs: Recent Labs     0000 01/28/22 1335 01/29/22 0247 01/29/22 1539 01/30/22 0216 01/30/22 1210 01/31/22 0405 01/31/22 1140  HGB   < >  --  13.5  --  13.9  --  12.8*  --   HCT  --   --  40.9  --  41.4  --  38.4*  --   PLT  --   --  158  --  155  --  142*  --   HEPARINUNFRC  --   --   --    < > 0.41 0.20* 0.54 0.55  CREATININE  --  1.08 1.11  --  1.31*  --   --   --    < > = values in this interval not displayed.     Estimated Creatinine Clearance: 45.8 mL/min (A) (by C-G formula based on SCr of 1.31 mg/dL (H)).   Medical History: Past Medical History:  Diagnosis Date   Diabetes mellitus without complication (HCC)    HLD (hyperlipidemia)    Shingles    TIA (transient ischemic attack) 12/16/2011    Assessment: 76yo male was admitted 11/25-27 for acute CVA, received TNKase and discharged on DAPT, now c/o recurrent numbness of left side >> repeat MRI reveals multiple new small acute infarcts, now awaiting right transcarotid artery stenting >> to begin heparin to prevent further strokes prior to vascular procedure.  Heparin level slightly supratherapeutic at 0.55 on heparin 1100 units/hr. No issues with heparin infusing or bleeding noted per RN. Plan for R ICA stenting today.   Goal of Therapy:  Heparin level 0.3-0.5 units/ml Monitor platelets by anticoagulation protocol: Yes   Plan:  Dec  heparin slightly to 1050 units/hr Plan to continue heparin drip until OR, then proceed with DAPT only post-op  08-27-1979, PharmD, BCPS 01/31/2022 12:53 PM

## 2022-01-31 NOTE — Anesthesia Preprocedure Evaluation (Addendum)
Anesthesia Evaluation  Patient identified by MRN, date of birth, ID band Patient awake    Reviewed: Allergy & Precautions, NPO status , Patient's Chart, lab work & pertinent test results  Airway Mallampati: II       Dental   Pulmonary former smoker   breath sounds clear to auscultation       Cardiovascular hypertension,  Rhythm:Regular Rate:Normal     Neuro/Psych TIACVA    GI/Hepatic Neg liver ROS,GERD  ,,  Endo/Other  diabetes    Renal/GU negative Renal ROS     Musculoskeletal   Abdominal   Peds  Hematology   Anesthesia Other Findings   Reproductive/Obstetrics                              Anesthesia Physical Anesthesia Plan  ASA: 3  Anesthesia Plan: General   Post-op Pain Management: Tylenol PO (pre-op)*   Induction:   PONV Risk Score and Plan: 3 and Ondansetron, Dexamethasone and Treatment may vary due to age or medical condition  Airway Management Planned: Oral ETT  Additional Equipment: Arterial line  Intra-op Plan:   Post-operative Plan:   Informed Consent: I have reviewed the patients History and Physical, chart, labs and discussed the procedure including the risks, benefits and alternatives for the proposed anesthesia with the patient or authorized representative who has indicated his/her understanding and acceptance.     Dental advisory given  Plan Discussed with: CRNA and Anesthesiologist  Anesthesia Plan Comments:         Anesthesia Quick Evaluation

## 2022-01-31 NOTE — Progress Notes (Signed)
  Progress Note    01/31/2022 1:56 PM Day of Surgery  Subjective:  still with left leg weakness  Vitals:   01/31/22 1135 01/31/22 1333  BP: (!) 158/86 (!) 145/82  Pulse: 84   Resp: 17 (!) 21  Temp: 98.2 F (36.8 C) 98.2 F (36.8 C)  SpO2: 98% 97%    Physical Exam: Aaox3 Moving all 4 extremities without limitation  CBC    Component Value Date/Time   WBC 7.7 01/31/2022 0405   RBC 4.78 01/31/2022 0405   HGB 12.8 (L) 01/31/2022 0405   HCT 38.4 (L) 01/31/2022 0405   PLT 142 (L) 01/31/2022 0405   MCV 80.3 01/31/2022 0405   MCH 26.8 01/31/2022 0405   MCHC 33.3 01/31/2022 0405   RDW 14.4 01/31/2022 0405   LYMPHSABS 2.1 01/28/2022 1210   MONOABS 0.8 01/28/2022 1210   EOSABS 0.2 01/28/2022 1210   BASOSABS 0.1 01/28/2022 1210    BMET    Component Value Date/Time   NA 140 01/30/2022 0216   K 4.2 01/30/2022 0216   CL 102 01/30/2022 0216   CO2 28 01/30/2022 0216   GLUCOSE 178 (H) 01/30/2022 0216   BUN 18 01/30/2022 0216   CREATININE 1.31 (H) 01/30/2022 0216   CREATININE 0.99 12/18/2014 1132   CALCIUM 9.7 01/30/2022 0216   GFRNONAA 56 (L) 01/30/2022 0216   GFRAA >90 12/16/2011 1855    INR    Component Value Date/Time   INR 1.0 01/28/2022 1210     Intake/Output Summary (Last 24 hours) at 01/31/2022 1356 Last data filed at 01/31/2022 0515 Gross per 24 hour  Intake 0.6 ml  Output 400 ml  Net -399.4 ml     Assessment:  76 y.o. male is here with symptomatic right ICA stenosis  Plan: OR today for Right TCAR  Continue aspirin, plavix and statin  Zavon Hyson C. Randie Heinz, MD [facility] Office: [phone] Pager: [phone]  01/31/2022 1:56 PM

## 2022-01-31 NOTE — Anesthesia Procedure Notes (Signed)
Procedure Name: Intubation Date/Time: 01/31/2022 2:15 PM  Performed by: Minerva Ends, CRNAPre-anesthesia Checklist: Patient identified, Emergency Drugs available, Suction available and Patient being monitored Patient Re-evaluated:Patient Re-evaluated prior to induction Oxygen Delivery Method: Circle system utilized Preoxygenation: Pre-oxygenation with 100% oxygen Induction Type: IV induction Ventilation: Mask ventilation without difficulty Laryngoscope Size: Mac and 3 Grade View: Grade I Tube type: Oral Tube size: 7.0 mm Number of attempts: 1 Airway Equipment and Method: Stylet and Oral airway Placement Confirmation: ETT inserted through vocal cords under direct vision, positive ETCO2 and breath sounds checked- equal and bilateral Secured at: 23 cm Tube secured with: Tape Dental Injury: Teeth and Oropharynx as per pre-operative assessment

## 2022-01-31 NOTE — Progress Notes (Signed)
  Progress Note   Patient: Gavin [name] ZJI:[id number] DOB: 03-13-1945 DOA: 01/28/2022     2 DOS: the patient was seen and examined on 01/31/2022 at 8:50AM      Brief hospital course: Mr. Caso is a 76 yo M with DM, HTN, and recent CVA who presented with worsening Left sided numbness.  Admitted 2 days PTA for small thalamic stroke.    After discharge, morning of this repeat admission, noted acute left hemiparesthesias, imbalance, so returned to the ER.    11/25-11/27: Admitted for thalamic stroke, s/p TNK  11/28: Readmitted, MRI shows new scattered acute infarcts in right MCA territory, given mixed plaque in right ICA on recent CTA, it was thought this was a high risk symptomatic carotid plaque, CEA recommended 11/30: Mild resurgence of facial numbness, right sided numbness     Assessment and Plan: * Acute ischemic stroke (HCC)    Symptomatic carotid artery stenosis, right - Consult vascular surgery and neurology - Continue aspirin, Plavix, and atorvastatin - Continue heparin  BPH (benign prostatic hyperplasia) - Continue Flomax  GERD (gastroesophageal reflux disease) - Continue PPI  HTN (hypertension) Allow permissive HTN - Hold losartan  HLD (hyperlipidemia) - Continue atorvastatin  DM (diabetes mellitus) (HCC) Glucose elevated. On glargine 24-26 plus sulfonylurea and mealtime short acting insulin at home.  Only on glargine 20 here with SS corrections. Will be NPO in morning. - Leave glargine as is for now, plan to increase after surgery - Continue SS corrections            Subjective: Has persistent left paresthesias, no weakness, no speech change, no fever.  URI better.     Physical Exam: BP 134/72 (BP Location: Right Arm)   Pulse 83   Temp 98 F (36.7 C) (Oral)   Resp 16   Ht 5\' 5"  (1.651 m)   Wt 76.4 kg   SpO2 97%   BMI 28.03 kg/m   Adult male, lying in bed, no acute distress RRR, no murmurs, no peripheral edema Respiratory rate  normal, lungs clear without rales or wheezes  Data Reviewed: No new labs  Family Communication: None present    Disposition: Status is: Inpatient         Author: , MD 01/31/2022 5:31 PM  For on call review [email or web address].

## 2022-02-01 LAB — LIPID PANEL
Cholesterol: 112 mg/dL (ref 0–200)
HDL: 31 mg/dL — ABNORMAL LOW (ref >40)
LDL Cholesterol: 66 mg/dL (ref 0–99)
Total CHOL/HDL Ratio: 3.6 RATIO
Triglycerides: 77 mg/dL (ref <150)
VLDL: 15 mg/dL (ref 0–40)

## 2022-02-01 LAB — CBC
HCT: 34.8 % — ABNORMAL LOW (ref 39.0–52.0)
Hemoglobin: 11.8 g/dL — ABNORMAL LOW (ref 13.0–17.0)
MCH: 27.1 pg (ref 26.0–34.0)
MCHC: 33.9 g/dL (ref 30.0–36.0)
MCV: 80 fL (ref 80.0–100.0)
Platelets: 140 10*3/uL — ABNORMAL LOW (ref 150–400)
RBC: 4.35 MIL/uL (ref 4.22–5.81)
RDW: 14.6 % (ref 11.5–15.5)
WBC: 8 10*3/uL (ref 4.0–10.5)
nRBC: 0 % (ref 0.0–0.2)

## 2022-02-01 LAB — BASIC METABOLIC PANEL
Anion gap: 11 (ref 5–15)
BUN: 11 mg/dL (ref 8–23)
CO2: 21 mmol/L — ABNORMAL LOW (ref 22–32)
Calcium: 9.3 mg/dL (ref 8.9–10.3)
Chloride: 105 mmol/L (ref 98–111)
Creatinine, Ser: 1.01 mg/dL (ref 0.61–1.24)
GFR, Estimated: >60 mL/min (ref >60)
Glucose, Bld: 178 mg/dL — ABNORMAL HIGH (ref 70–99)
Potassium: 4 mmol/L (ref 3.5–5.1)
Sodium: 137 mmol/L (ref 135–145)

## 2022-02-01 LAB — GLUCOSE, CAPILLARY
Glucose-Capillary: 197 mg/dL — ABNORMAL HIGH (ref 70–99)
Glucose-Capillary: 202 mg/dL — ABNORMAL HIGH (ref 70–99)

## 2022-02-01 MED ORDER — BENZONATATE 100 MG PO CAPS: 100.0000 mg | ORAL_CAPSULE | Freq: Four times a day (QID) | ORAL | 0 refills | Status: AC | PRN

## 2022-02-01 MED ORDER — HYDROCOD POLI-CHLORPHE POLI ER 10-8 MG/5ML PO SUER: 5.0000 mL | Freq: Three times a day (TID) | ORAL | 0 refills | Status: AC | PRN

## 2022-02-01 MED ORDER — HYDROCOD POLI-CHLORPHE POLI ER 10-8 MG/5ML PO SUER: 5.0000 mL | Freq: Every evening | ORAL | Status: DC | PRN

## 2022-02-01 MED ORDER — ALBUTEROL SULFATE (2.5 MG/3ML) 0.083% IN NEBU
2.5000 mg | INHALATION_SOLUTION | Freq: Once | RESPIRATORY_TRACT | Status: AC
  Administered 2022-02-01: 2.5 mg via RESPIRATORY_TRACT
  Filled 2022-02-01: qty 3

## 2022-02-01 NOTE — TOC Transition Note (Addendum)
Transition of Care [facility]) - CM/SW Discharge Note   Patient Details  Name: Gavin [name] MRN: [id number] Date of Birth: [birth year]  [facility] CM/SW Contact:  Gala Lewandowsky, RN Phone Number: 02/01/2022, 1:19 PM   Clinical Narrative: Case Manager discussed outpatient PT with the patient and he wants to wait until he returns to [facility] in late December to start treatments. Patient wants a Rx for outpatient PT to be written from the physician. No further needs identified at this time.   1352 02-01-22 Patient now wants outpatient PT here in [place]. Patient wants the referral to go to the [facility] location. Ambulatory referral submitted. No further needs identified at this time.   Final next level of care: Home/Self Care Barriers to Discharge: No Barriers Identified Readmission Risk Interventions     No data to display

## 2022-02-01 NOTE — Discharge Summary (Signed)
5  Physician Discharge Summary   Patient: Gavin [name] MRN: [id number] DOB: [birth year]  Admit date:     01/28/2022  Discharge date: 02/01/22  Discharge Physician: Alberteen Sam   PCP: Pcp, No     Recommendations at discharge:  Follow up with PCP Dr. Fae Pippin in 2 weeks for stroke Follow up with Vascular Surgery for transcatheter placement of right carotid stent     Discharge Diagnoses: Principal Problem:   Acute ischemic stroke [facility]) Active Problems:   Symptomatic carotid artery stenosis, right   DM (diabetes mellitus) (HCC)   HLD (hyperlipidemia)   HTN (hypertension)   GERD (gastroesophageal reflux disease)   History of CVA (cerebrovascular accident)   BPH (benign prostatic hyperplasia)      Hospital Course: Gavin [name] is a 76 yo M with DM, HTN, and recent CVA who presented with worsening Left sided numbness.  Admitted 2 days prior to arrival for small thalamic stroke.    After discharge, morning of this repeat admission, noted acute left hemiparesthesias, imbalance, so returned to the ER.   MRI shows new scattered acute infarcts in right MCA territory.   Given the mixed plaque visualized on CTA of his right ICA, it was thought this was a high risk symptomatic carotid plaque, carotid revascularization was recommended.    * Acute ischemic stroke (HCC) Symptomatic carotid artery stenosis, right He was readmitted and Vascular surgery were consulted.  He underwent TCAR on 12/1 by Dr. Randie Heinz.    Post op course was uncomplicated and he was cleared for discharge by Vascular Surgery.    PT recommended outpatient PT.   Discharged on aspirin and Plavix and statin.  Has close follow up with Vascular Surgery here before he returns home to [place].                 The [facility] Controlled Substances Registry was reviewed for this patient prior to discharge.   Consultants: Neurology, Vascular surgery Procedures performed:  - MRI brain -  Transcatheter placement of right carotid En route stent on 12/1 by Dr. Randie Heinz   Disposition: Home Diet recommendation:  Discharge Diet Orders (From admission, onward)     Start     Ordered   02/01/22 0000  Diet - low sodium heart healthy        02/01/22 1404             DISCHARGE MEDICATION: Allergies as of 02/01/2022       Reactions   Altace [ramipril] Anaphylaxis   Cortizone-10 [hydrocortisone] Other (See Comments)   Vision problems- affected ability to see far away        Medication List     STOP taking these medications    losartan 50 MG tablet Commonly known as: COZAAR       TAKE these medications    acetaminophen 325 MG tablet Commonly known as: TYLENOL Take 325-650 mg by mouth every 6 (six) hours as needed for mild pain or headache.   Aspirin Low Dose 81 MG chewable tablet Generic drug: aspirin Chew 1 tablet (81 mg total) by mouth daily.   atorvastatin 40 MG tablet Commonly known as: LIPITOR Take 1 tablet (40 mg total) by mouth daily.   benzonatate 100 MG capsule Commonly known as: Tessalon Perles Take 1 capsule (100 mg total) by mouth every 6 (six) hours as needed for cough.   chlorpheniramine-HYDROcodone 10-8 MG/5ML Commonly known as: TUSSIONEX Take 5 mLs by mouth every 8 (eight) hours as needed for cough.  clopidogrel 75 MG tablet Commonly known as: PLAVIX Take 1 tablet (75 mg total) by mouth daily.   finasteride 5 MG tablet Commonly known as: PROSCAR Take 5 mg by mouth daily.   glimepiride 1 MG tablet Commonly known as: AMARYL Take 1 mg by mouth daily.   metFORMIN 1000 MG tablet Commonly known as: GLUCOPHAGE Take 1,000 mg by mouth 2 (two) times daily with a meal.   Monovisc 88 MG/4ML Sosy intra-articular injection Generic drug: Hyaluronan Inject into the articular space See admin instructions. Into bilateral knees every 3 months   NovoLOG FlexPen 100 UNIT/ML FlexPen Generic drug: insulin aspart Inject 14-16 Units into the  skin in the morning.   omeprazole 20 MG capsule Commonly known as: PRILOSEC Take 20 mg by mouth daily before breakfast.   PRESCRIPTION MEDICATION Apply 1 application  topically See admin instructions. Mometasone Furoate 0.1% Topical Solution- Apply to the scalp three times a week   Sensodyne Pronamel 5-0.25 % Pste Place 1 application  onto teeth in the morning and at bedtime.   sildenafil 50 MG tablet Commonly known as: VIAGRA Take 50 mg by mouth daily as needed.   tamsulosin 0.4 MG Caps capsule Commonly known as: FLOMAX Take 0.4 mg by mouth daily.   Toujeo SoloStar 300 UNIT/ML Solostar Pen Generic drug: insulin glargine (1 Unit Dial) Inject 24-26 Units into the skin at bedtime.               Discharge Care Instructions  (From admission, onward)           Start     Ordered   02/01/22 0000  Discharge wound care:       Comments: As directed by Vascular Surgery   02/01/22 1404            Follow-up Information     [facility] Follow up in 4 week(s).   Specialty: Vascular Surgery Why: Office will call you to arrange your appt (sent). Contact information: [address] [place] [address] [phone]        [facility] Follow up.   Specialty: Rehabilitation Why: Office to call with visit times for outpatient PT evaluation and treatment. Contact information: [address] [place] [address] [phone]                Discharge Instructions     Ambulatory referral to Physical Therapy   Complete by: As directed    For diagnosis: Right MCA embolic stroke I63.419   Ambulatory referral to Physical Therapy   Complete by: As directed    Outpatient physical therapy evaluation and treatment. For diagnosis: Right MCA embolic stroke I63.419   Diet - low sodium heart healthy   Complete by: As directed    Discharge instructions   Complete by:  As directed    **IMPORTANT DISCHARGE INSTRUCTIONS**  From Dr. Verlee Rossetti were admitted for recurrent stroke, which we believe was from the right carotid plaque  This plaque was treated with surgery by Dr. Randie Heinz  Call his office (the [facility], number listed below) to arrange an appointment for ultrasound and to see him the week of 12/20  Follow all post-op instructions he gave you  MAKE SURE to take your aspirin and Plavix every day  Resume your other home medicines  For the next week, you can HOLD (do not take) your losartan  Check your blood pressure at home, if it is consistently ABOVE 140  for the top number, you can restart it.  Otherwise, don't take the losartan until you get back to [place]  FOr cough: Take Tessalon perles (the pill with beads in it, the prescription version of robitussin)  For very  bad cough, take the Tussionex, which is an opiate cough medicine like codeine   Discharge wound care:   Complete by: As directed    As directed by Vascular Surgery   Increase activity slowly   Complete by: As directed        Discharge Exam: Filed Weights   01/29/22 0600  Weight: 76.4 kg    General: Pt is alert, awake, not in acute distress Cardiovascular: RRR, nl S1-S2, no murmurs appreciated.   No LE edema.   Respiratory: Normal respiratory rate and rhythm.  CTAB without rales or wheezes. Abdominal: Abdomen soft and non-tender.  No distension or HSM.   Neuro/Psych: Strength symmetric in upper and lower extremities.  Judgment and insight appear normal.   Condition at discharge: good  The results of significant diagnostics from this hospitalization (including imaging, microbiology, ancillary and laboratory) are listed below for reference.   Imaging Studies: Structural Heart Procedure  Result Date: 01/31/2022 See surgical note for result.  HYBRID OR IMAGING ([facility] ONLY)  Result Date: 01/31/2022 There is no interpretation for this exam.  This  order is for images obtained during a surgical procedure.  Please See "Surgeries" Tab for more information regarding the procedure.   MR BRAIN WO CONTRAST  Result Date: 01/28/2022 CLINICAL DATA:  Stroke, follow up. Recent stroke. Recurrent left arm and leg numbness. EXAM: MRI HEAD WITHOUT CONTRAST TECHNIQUE: Multiplanar, multiecho pulse sequences of the brain and surrounding structures were obtained without intravenous contrast. COMPARISON:  Head CT 01/28/2022 and MRI 01/26/2022 FINDINGS: Brain: Compared with the recent prior MRI, there are multiple new subcentimeter acute cortical and subcortical infarcts in the right frontal and right parietal lobes. A recent right thalamic infarct is unchanged in size. No mass, midline shift, or extra-axial fluid collection is identified. Chronic T2 hyperintensities in the cerebral white matter bilaterally are unchanged and nonspecific but compatible with mild chronic small vessel ischemic disease. There is mild cerebral atrophy. A single chronic microhemorrhage is again noted in the right centrum semiovale. Vascular: Major intracranial vascular flow voids are preserved. Skull and upper cervical spine: Unremarkable bone marrow signal. Sinuses/Orbits: Bilateral cataract extraction. Mild mucosal thickening in the paranasal sinuses. Clear mastoid air cells. Other: None. IMPRESSION: 1. Multiple new small acute infarcts in the right frontal and parietal lobes. 2. Unchanged recent right thalamic infarct. 3. Mild chronic small vessel ischemic disease. Electronically Signed   By: Sebastian Ache M.D.   On: 01/28/2022 15:16   MR ANGIO HEAD WO CONTRAST  Result Date: 01/28/2022 CLINICAL DATA:  Stroke, follow up. Recent stroke. Recurrent left arm and leg numbness. EXAM: MRA HEAD WITHOUT CONTRAST TECHNIQUE: Angiographic images of the Circle of Willis were acquired using MRA technique without intravenous contrast. COMPARISON:  Head and neck CTA 01/25/2022 FINDINGS: Anterior circulation:  The internal carotid arteries are widely patent from skull base to carotid termini. ACAs and MCAs are patent without evidence of a proximal branch occlusion or significant A1 or M1 stenosis. Detailed assessment for potential branch vessel stenoses is limited by motion artifact, however no significant stenosis was evident on the recent prior CTA. No aneurysm is identified. Posterior circulation: The included intracranial vertebral arteries are patent to the basilar. The right PICA and left AICA appear dominant. Patent SCA origins are  seen bilaterally. The basilar artery is widely patent. There is a large right posterior communicating artery with hypoplastic or absent right P1 segment. Both PCAs are patent without evidence of a significant proximal stenosis. No aneurysm is identified. Anatomic variants: Fetal right PCA.  Absent right A1 segment. IMPRESSION: No large vessel occlusion or significant proximal stenosis. Electronically Signed   By: Sebastian Ache M.D.   On: 01/28/2022 15:07   CT HEAD CODE STROKE WO CONTRAST  Result Date: 01/28/2022 CLINICAL DATA:  Code stroke. Neuro deficit, acute, stroke suspected. EXAM: CT HEAD WITHOUT CONTRAST TECHNIQUE: Contiguous axial images were obtained from the base of the skull through the vertex without intravenous contrast. RADIATION DOSE REDUCTION: This exam was performed according to the departmental dose-optimization program which includes automated exposure control, adjustment of the mA and/or kV according to patient size and/or use of iterative reconstruction technique. COMPARISON:  MRI 2 days ago.  Head CT 3 days ago. FINDINGS: Brain: The brainstem and cerebellum appear normal. Focal low-density in the posterolateral right thalamus corresponds with the acute infarction shown by recent MRI. No evidence of progression or hemorrhage. Otherwise, cerebral hemispheres show age related volume loss without other acute insult. Small right posterior frontal cortical infarction  shown by MRI is not visible. No mass, hemorrhage, hydrocephalus or extra-axial collection. Vascular: Major vessels at the base of the brain show flow. Skull: Negative Sinuses/Orbits: Mild mucosal thickening.  Orbits normal. Other: None ASPECTS (Alberta Stroke Program Early CT Score) - Ganglionic level infarction (caudate, lentiform nuclei, internal capsule, insula, M1-M3 cortex): 7 - Supraganglionic infarction (M4-M6 cortex): 3 Total score (0-10 with 10 being normal): 10 IMPRESSION: 1. Focal low-density in the posterolateral right thalamus corresponds with the acute infarction shown by recent MRI. No evidence of progression or hemorrhage. 2. Small right posterior frontal cortical infarction shown by MRI is not visible. No other finding. 3. Aspects is 10. These results were communicated to Dr. Roda Shutters at 12:27 pm on 01/28/2022 by text page via the [facility] messaging system. Electronically Signed   By: Paulina Fusi M.D.   On: 01/28/2022 12:28   ECHOCARDIOGRAM COMPLETE  Result Date: 01/26/2022    ECHOCARDIOGRAM REPORT   Patient Name:   Basil Buffin Date of Exam: 01/26/2022 Medical Rec #:  [id number]       Height:       65.0 in Accession #:    [id number]      Weight:       168.4 lb Date of Birth:  04/22/1945       BSA:          1.839 m Patient Age:    76 years        BP:           161/68 mmHg Patient Gender: M               HR:           74 bpm. Exam Location:  Inpatient Procedure: 2D Echo, Cardiac Doppler and Color Doppler Indications:    Stroke  History:        Patient has prior history of Echocardiogram examinations, most                 recent 01/01/2015. Risk Factors:Diabetes, Dyslipidemia,                 Hypertension and Former Smoker. GERD.  Sonographer:    Ross Ludwig RDCS (AE) Referring Phys: MCNEILL P [facility]  Sonographer Comments: Suboptimal parasternal window. IMPRESSIONS  1. Left ventricular ejection fraction, by estimation, is 50 to 55%. The left ventricle has low normal function. The left ventricle  demonstrates regional wall motion abnormalities (see scoring diagram/findings for description). There is mild concentric left ventricular hypertrophy. Left ventricular diastolic parameters are indeterminate.  2. Right ventricular systolic function is normal. The right ventricular size is normal. Tricuspid regurgitation signal is inadequate for assessing PA pressure.  3. The mitral valve is grossly normal. Trivial mitral valve regurgitation.  4. The aortic valve was not well visualized. Aortic valve regurgitation is not visualized. Aortic valve mean gradient measures 2.0 mmHg.  5. The inferior vena cava is normal in size with greater than 50% respiratory variability, suggesting right atrial pressure of 3 mmHg. Comparison(s): Prior images unable to be directly viewed. FINDINGS  Left Ventricle: Left ventricular ejection fraction, by estimation, is 50 to 55%. The left ventricle has low normal function. The left ventricle demonstrates regional wall motion abnormalities. The left ventricular internal cavity size was normal in size. There is mild concentric left ventricular hypertrophy. Left ventricular diastolic parameters are indeterminate.  LV Wall Scoring: The inferior wall and basal inferoseptal segment are hypokinetic. The entire anterior wall, entire lateral wall, entire anterior septum, entire apex, and mid inferoseptal segment are normal. Right Ventricle: The right ventricular size is normal. No increase in right ventricular wall thickness. Right ventricular systolic function is normal. Tricuspid regurgitation signal is inadequate for assessing PA pressure. Left Atrium: Left atrial size was normal in size. Right Atrium: Right atrial size was normal in size. Pericardium: There is no evidence of pericardial effusion. Mitral Valve: The mitral valve is grossly normal. Trivial mitral valve regurgitation. Tricuspid Valve: The tricuspid valve is grossly normal. Tricuspid valve regurgitation is trivial. Aortic Valve: The  aortic valve was not well visualized. Aortic valve regurgitation is not visualized. Aortic valve mean gradient measures 2.0 mmHg. Aortic valve peak gradient measures 4.7 mmHg. Aortic valve area, by VTI measures 2.43 cm. Pulmonic Valve: The pulmonic valve was not well visualized. Pulmonic valve regurgitation is not visualized. Aorta: The aortic root is normal in size and structure. Venous: The inferior vena cava is normal in size with greater than 50% respiratory variability, suggesting right atrial pressure of 3 mmHg. IAS/Shunts: No atrial level shunt detected by color flow Doppler.  LEFT VENTRICLE PLAX 2D LVIDd:         4.40 cm     Diastology LVIDs:         3.10 cm     LV e' medial:    8.59 cm/s LV PW:         1.20 cm     LV E/e' medial:  7.7 LV IVS:        1.10 cm     LV e' lateral:   7.62 cm/s LVOT diam:     2.10 cm     LV E/e' lateral: 8.6 LV SV:         53 LV SV Index:   29 LVOT Area:     3.46 cm  LV Volumes (MOD) LV vol d, MOD A2C: 70.3 ml LV vol d, MOD A4C: 93.4 ml LV vol s, MOD A2C: 31.5 ml LV vol s, MOD A4C: 48.8 ml LV SV MOD A2C:     38.8 ml LV SV MOD A4C:     93.4 ml LV SV MOD BP:      43.3 ml RIGHT VENTRICLE             IVC RV Basal  diam:  3.10 cm     IVC diam: 1.90 cm RV S prime:     13.10 cm/s TAPSE (M-mode): 1.9 cm LEFT ATRIUM           Index        RIGHT ATRIUM           Index LA diam:      3.70 cm 2.01 cm/m   RA Area:     12.00 cm LA Vol (A4C): 49.1 ml 26.70 ml/m  RA Volume:   24.10 ml  13.11 ml/m  AORTIC VALVE AV Area (Vmax):    2.69 cm AV Area (Vmean):   2.53 cm AV Area (VTI):     2.43 cm AV Vmax:           108.00 cm/s AV Vmean:          70.600 cm/s AV VTI:            0.218 m AV Peak Grad:      4.7 mmHg AV Mean Grad:      2.0 mmHg LVOT Vmax:         83.80 cm/s LVOT Vmean:        51.500 cm/s LVOT VTI:          0.153 m LVOT/AV VTI ratio: 0.70  AORTA Ao Root diam: 3.30 cm Ao Asc diam:  3.30 cm MITRAL VALVE MV Area (PHT): 2.37 cm    SHUNTS MV Decel Time: 320 msec    Systemic VTI:  0.15 m MV  E velocity: 65.80 cm/s  Systemic Diam: 2.10 cm MV A velocity: 62.50 cm/s MV E/A ratio:  1.05 Nona Dell MD Electronically signed by Nona Dell MD Signature Date/Time: 01/26/2022/4:44:19 PM    Final    MR BRAIN WO CONTRAST  Result Date: 01/26/2022 CLINICAL DATA:  Stroke follow-up. EXAM: MRI HEAD WITHOUT CONTRAST TECHNIQUE: Multiplanar, multiecho pulse sequences of the brain and surrounding structures were obtained without intravenous contrast. COMPARISON:  CT/CTA head and neck 1 day prior FINDINGS: Brain: There is an acute infarct in the right thalamus without associated hemorrhage or mass effect. There is additional punctate infarct in the high right frontal lobe cortex (2-44). There is no acute intracranial hemorrhage, extra-axial fluid collection, or acute infarct. Parenchymal volume is stable. The ventricles are stable in size. Patchy FLAIR signal abnormality in the supratentorial white matter likely reflects sequela of underlying chronic small-vessel ischemic change. This has progressed since 2013. There is a single punctate chronic microhemorrhage in the right centrum semiovale, nonspecific. There is no mass lesion.  There is no mass effect or midline shift. Vascular: Normal flow voids. Skull and upper cervical spine: Normal marrow signal. Sinuses/Orbits: There is mild mucosal thickening in the paranasal sinuses. Bilateral lens implants are in place. The globes and orbits are otherwise unremarkable. Other: None. IMPRESSION: Acute infarct in the right thalamus and punctate acute infarct in the high right frontal lobe cortex. Electronically Signed   By: Lesia Hausen M.D.   On: 01/26/2022 15:28   CT HEAD WO CONTRAST  Result Date: 01/25/2022 CLINICAL DATA:  Stroke, follow-up EXAM: CT HEAD WITHOUT CONTRAST TECHNIQUE: Contiguous axial images were obtained from the base of the skull through the vertex without intravenous contrast. RADIATION DOSE REDUCTION: This exam was performed according to the  departmental dose-optimization program which includes automated exposure control, adjustment of the mA and/or kV according to patient size and/or use of iterative reconstruction technique. COMPARISON:  01/25/2022 2:06 p.m. FINDINGS: Brain: No evidence of  acute infarction, hemorrhage, mass, mass effect, or midline shift. No hydrocephalus or extra-axial fluid collection. Vascular: No hyperdense vessel. Atherosclerotic calcifications in the intracranial carotid and vertebral arteries. Skull: Normal. Negative for fracture or focal lesion. Sinuses/Orbits: Mucosal thickening in the ethmoid air cells. Status post bilateral lens replacements. Other: The mastoid air cells are well aerated. IMPRESSION: No acute intracranial process. Electronically Signed   By: Wiliam Ke M.D.   On: 01/25/2022 21:02   CT ANGIO HEAD NECK W WO CM (CODE STROKE)  Result Date: 01/25/2022 CLINICAL DATA:  Code stroke EXAM: CT ANGIOGRAPHY HEAD AND NECK TECHNIQUE: Multidetector CT imaging of the head and neck was performed using the standard protocol during bolus administration of intravenous contrast. Multiplanar CT image reconstructions and MIPs were obtained to evaluate the vascular anatomy. Carotid stenosis measurements (when applicable) are obtained utilizing NASCET criteria, using the distal internal carotid diameter as the denominator. RADIATION DOSE REDUCTION: This exam was performed according to the departmental dose-optimization program which includes automated exposure control, adjustment of the mA and/or kV according to patient size and/or use of iterative reconstruction technique. CONTRAST:  75mL OMNIPAQUE IOHEXOL 350 MG/ML SOLN COMPARISON:  Same-day noncontrast head CT, CTA neck 12/17/2011, MRA head and neck 12/16/2011. FINDINGS: CTA NECK FINDINGS Aortic arch: There is mild calcified plaque in the aortic arch. The origins of the major branch vessels are patent. The subclavian arteries are patent to the level imaged. Right carotid  system: The right common carotid artery is patent. There is mixed plaque in the proximal internal carotid artery without hemodynamically significant stenosis relative to the distal vessel. The distal internal carotid artery is widely patent. The external carotid artery is patent. There is no dissection or aneurysm. Left carotid system: The left common, internal, and external carotid arteries are patent, with mild plaque at the bifurcation but no hemodynamically significant stenosis or occlusion. There is no dissection or aneurysm. Vertebral arteries: The vertebral arteries are patent, without hemodynamically significant stenosis or occlusion. There is no dissection or aneurysm. Skeleton: There is disc space narrowing and degenerative endplate change in the lower cervical spine. There is no acute osseous abnormality or suspicious osseous lesion. There is no visible canal hematoma. Other neck: The soft tissues of the neck are unremarkable. Upper chest: The imaged lung apices are clear. Review of the MIP images confirms the above findings CTA HEAD FINDINGS Anterior circulation: There is mild calcified plaque in the carotid siphons without significant stenosis or occlusion. The bilateral MCAs are patent, without proximal stenosis or occlusion. The bilateral ACAs are patent, without proximal stenosis or occlusion. The right A1 segment is hypoplastic/absent, a normal variant. There is no aneurysm or AVM. Posterior circulation: The bilateral V4 segments are patent. The basilar artery is patent. The major cerebellar arteries appear patent. The bilateral PCAs are patent, without proximal stenosis or occlusion. There is a fetal origin of the right PCA. A diminutive left posterior communicating artery is also identified. There is no aneurysm or AVM. Venous sinuses: Patent. Anatomic variants: As above. Review of the MIP images confirms the above findings IMPRESSION: 1. No emergent large vessel occlusion. 2. Atherosclerotic  plaque at the carotid bifurcations, right more than left, without hemodynamically significant stenosis or occlusion. Findings communicated to Dr. Amada Jupiter via [employer] at 2:30 p.m. Electronically Signed   By: Lesia Hausen M.D.   On: 01/25/2022 14:32   CT HEAD CODE STROKE WO CONTRAST  Result Date: 01/25/2022 CLINICAL DATA:  Code stroke. EXAM: CT HEAD WITHOUT CONTRAST TECHNIQUE: Contiguous axial  images were obtained from the base of the skull through the vertex without intravenous contrast. RADIATION DOSE REDUCTION: This exam was performed according to the departmental dose-optimization program which includes automated exposure control, adjustment of the mA and/or kV according to patient size and/or use of iterative reconstruction technique. COMPARISON:  Head CT 12/16/2011 FINDINGS: Brain: There is no acute intracranial hemorrhage, extra-axial fluid collection, or acute infarct. Parenchymal volume is within expected limits for age. The ventricles are normal in size. Gray-white differentiation is preserved. There is mild background chronic small-vessel ischemic change. There is no mass lesion.  There is no mass effect or midline shift. Vascular: There is calcification of the bilateral carotid siphons. Skull: Normal. Negative for fracture or focal lesion. Sinuses/Orbits: There is mild mucosal thickening in the paranasal sinuses. Bilateral lens implants are in place. The globes and orbits are otherwise unremarkable. Other: None. ASPECTS [facility] Stroke Program Early CT Score) - Ganglionic level infarction (caudate, lentiform nuclei, internal capsule, insula, M1-M3 cortex): 7 - Supraganglionic infarction (M4-M6 cortex): 3 Total score (0-10 with 10 being normal): 10 IMPRESSION: 1. No acute intracranial pathology. 2. ASPECTS is 10 Findings communicated via Amion to Dr. Amada Jupiter at 2:11 p.m. Electronically Signed   By: Lesia Hausen M.D.   On: 01/25/2022 14:13    Microbiology: Results for orders placed or performed  during the hospital encounter of 01/28/22  Resp Panel by RT-PCR (Flu A&B, Covid) Anterior Nasal Swab     Status: None   Collection Time: 01/29/22  4:24 PM   Specimen: Anterior Nasal Swab  Result Value Ref Range Status   SARS Coronavirus 2 by RT PCR NEGATIVE NEGATIVE Final    Comment: (NOTE) SARS-CoV-2 target nucleic acids are NOT DETECTED.  The SARS-CoV-2 RNA is generally detectable in upper respiratory specimens during the acute phase of infection. The lowest concentration of SARS-CoV-2 viral copies this assay can detect is 138 copies/mL. A negative result does not preclude SARS-Cov-2 infection and should not be used as the sole basis for treatment or other patient management decisions. A negative result may occur with  improper specimen collection/handling, submission of specimen other than nasopharyngeal swab, presence of viral mutation(s) within the areas targeted by this assay, and inadequate number of viral copies(<138 copies/mL). A negative result must be combined with clinical observations, patient history, and epidemiological information. The expected result is Negative.  Fact Sheet for Patients:  BloggerCourse.com  Fact Sheet for Healthcare Providers:  SeriousBroker.it  This test is no t yet approved or cleared by the [place] FDA and  has been authorized for detection and/or diagnosis of SARS-CoV-2 by FDA under an Emergency Use Authorization (EUA). This EUA will remain  in effect (meaning this test can be used) for the duration of the COVID-19 declaration under Section 564(b)(1) of the Act, 21 U.S.C.section 360bbb-3(b)(1), unless the authorization is terminated  or revoked sooner.       Influenza A by PCR NEGATIVE NEGATIVE Final   Influenza B by PCR NEGATIVE NEGATIVE Final    Comment: (NOTE) The Xpert Xpress SARS-CoV-2/FLU/RSV plus assay is intended as an aid in the diagnosis of influenza from Nasopharyngeal  swab specimens and should not be used as a sole basis for treatment. Nasal washings and aspirates are unacceptable for Xpert Xpress SARS-CoV-2/FLU/RSV testing.  Fact Sheet for Patients: BloggerCourse.com  Fact Sheet for Healthcare Providers: SeriousBroker.it  This test is not yet approved or cleared by the [place] FDA and has been authorized for detection and/or diagnosis of SARS-CoV-2 by FDA  under an Emergency Use Authorization (EUA). This EUA will remain in effect (meaning this test can be used) for the duration of the COVID-19 declaration under Section 564(b)(1) of the Act, 21 U.S.C. section 360bbb-3(b)(1), unless the authorization is terminated or revoked.  Performed at [facility], [address] [place], [place] [address]   Respiratory (~20 pathogens) panel by PCR     Status: None   Collection Time: 01/29/22  4:24 PM   Specimen: Nasopharyngeal Swab; Respiratory  Result Value Ref Range Status   Adenovirus NOT DETECTED NOT DETECTED Final   Coronavirus 229E NOT DETECTED NOT DETECTED Final    Comment: (NOTE) The Coronavirus on the Respiratory Panel, DOES NOT test for the novel  Coronavirus (2019 nCoV)    Coronavirus HKU1 NOT DETECTED NOT DETECTED Final   Coronavirus NL63 NOT DETECTED NOT DETECTED Final   Coronavirus OC43 NOT DETECTED NOT DETECTED Final   Metapneumovirus NOT DETECTED NOT DETECTED Final   Rhinovirus / Enterovirus NOT DETECTED NOT DETECTED Final   Influenza A NOT DETECTED NOT DETECTED Final   Influenza B NOT DETECTED NOT DETECTED Final   Parainfluenza Virus 1 NOT DETECTED NOT DETECTED Final   Parainfluenza Virus 2 NOT DETECTED NOT DETECTED Final   Parainfluenza Virus 3 NOT DETECTED NOT DETECTED Final   Parainfluenza Virus 4 NOT DETECTED NOT DETECTED Final   Respiratory Syncytial Virus NOT DETECTED NOT DETECTED Final   Bordetella pertussis NOT DETECTED NOT DETECTED Final   Bordetella  Parapertussis NOT DETECTED NOT DETECTED Final   Chlamydophila pneumoniae NOT DETECTED NOT DETECTED Final   Mycoplasma pneumoniae NOT DETECTED NOT DETECTED Final    Comment: Performed at [facility], [address] [place], [place] [address]    Labs: CBC: Recent Labs  Lab 01/28/22 1210 01/28/22 1211 01/29/22 0247 01/30/22 0216 01/31/22 0405 02/01/22 0430  WBC 10.0  --  10.8* 10.1 7.7 8.0  NEUTROABS 6.8  --   --   --   --   --   HGB 14.4 15.0 13.5 13.9 12.8* 11.8*  HCT 44.3 44.0 40.9 41.4 38.4* 34.8*  MCV 81.1  --  81.5 81.7 80.3 80.0  PLT 185  --  158 155 142* 140*   Basic Metabolic Panel: Recent Labs  Lab 01/28/22 1211 01/28/22 1335 01/29/22 0247 01/30/22 0216 02/01/22 0430  NA 135 140 137 140 137  K 6.4* 3.9 3.9 4.2 4.0  CL 102 105 105 102 105  CO2  --  25 20* 28 21*  GLUCOSE 257* 178* 243* 178* 178*  BUN 24* 15 17 18 11   CREATININE 1.00 1.08 1.11 1.31* 1.01  CALCIUM  --  10.0 9.4 9.7 9.3   Liver Function Tests: Recent Labs  Lab 01/28/22 1335 01/29/22 0247 01/30/22 0216  AST 18 16 19   ALT 16 14 18   ALKPHOS 53 47 53  BILITOT 0.6 0.6 0.7  PROT 6.5 6.1* 6.3*  ALBUMIN 3.8 3.6 3.6   CBG: Recent Labs  Lab 01/31/22 1330 01/31/22 1542 01/31/22 2144 02/01/22 0609 02/01/22 1236  GLUCAP 111* 120* 122* 197* 202*    Discharge time spent: approximately 35 minutes spent on discharge counseling, evaluation of patient on day of discharge, and coordination of discharge planning with nursing, social work, pharmacy and case management  Signed: Alberteen Samhristopher P Joran Kallal, MD [facility] 02/01/2022

## 2022-02-01 NOTE — Progress Notes (Addendum)
  Progress Note    02/01/2022 10:39 AM 1 Day Post-Op  Subjective:  still has some hoarseness and numbness but this is not new  Afebrile HR 60's-90's NSR 100's-130's systolic 99% RA  Gtts:  none  Vitals:   02/01/22 0400 02/01/22 0758  BP: 121/73 105/64  Pulse: 80 73  Resp:    Temp: 98 F (36.7 C) 97.9 F (36.6 C)  SpO2: 96% 94%     Physical Exam: Neuro:  at base line Lungs:  non labored Incision:  clean and dry; left groin is soft without hematoma  CBC    Component Value Date/Time   WBC 8.0 02/01/2022 0430   RBC 4.35 02/01/2022 0430   HGB 11.8 (L) 02/01/2022 0430   HCT 34.8 (L) 02/01/2022 0430   PLT 140 (L) 02/01/2022 0430   MCV 80.0 02/01/2022 0430   MCH 27.1 02/01/2022 0430   MCHC 33.9 02/01/2022 0430   RDW 14.6 02/01/2022 0430   LYMPHSABS 2.1 01/28/2022 1210   MONOABS 0.8 01/28/2022 1210   EOSABS 0.2 01/28/2022 1210   BASOSABS 0.1 01/28/2022 1210    BMET    Component Value Date/Time   NA 137 02/01/2022 0430   K 4.0 02/01/2022 0430   CL 105 02/01/2022 0430   CO2 21 (L) 02/01/2022 0430   GLUCOSE 178 (H) 02/01/2022 0430   BUN 11 02/01/2022 0430   CREATININE 1.01 02/01/2022 0430   CREATININE 0.99 12/18/2014 1132   CALCIUM 9.3 02/01/2022 0430   GFRNONAA >60 02/01/2022 0430   GFRAA >90 12/16/2011 1855     Intake/Output Summary (Last 24 hours) at 02/01/2022 1039 Last data filed at 02/01/2022 0519 Gross per 24 hour  Intake 2450 ml  Output 575 ml  Net 1875 ml     Assessment/Plan:  This is a 76 y.o. male who is s/p right TCAR 1 Day Post-Op  -pt is doing well this am from vascular standpoint. -pt neuro exam is at baseline  -continue asa/statin/plavix -f/u with [facility] around 12/20  with carotid duplex on Dr. Randie Heinz clinic day prior to pt returning to [place] (12/26).  Our office will arrange appt.    Doreatha Massed, PA-C [facility] [phone]  VASCULAR STAFF ADDENDUM: I have independently interviewed and examined the  patient. I agree with the above.  Doing well status post right TCAR.  Hoarse voice baseline over the last week. No neurodeficits Patient okay for discharge from a vascular surgery perspective Recommend aspirin, Plavix, high intensity statin.  We will plan for 2-week follow-up  J. Gillis Santa, MD [facility] Phone Number: [phone] 02/01/2022 12:33 PM

## 2022-02-01 NOTE — Progress Notes (Signed)
PHARMACIST LIPID MONITORING   Gavin [name] is a 76 y.o. male admitted on 01/28/2022 s/p carotid stent.  Pharmacy has been consulted to optimize lipid-lowering therapy with the indication of secondary prevention for clinical ASCVD.  Recent Labs:  Lipid Panel (last 6 months):   Lab Results  Component Value Date   CHOL 112 02/01/2022   TRIG 77 02/01/2022   HDL 31 (L) 02/01/2022   CHOLHDL 3.6 02/01/2022   VLDL 15 02/01/2022   LDLCALC 66 02/01/2022    Hepatic function panel (last 6 months):   Lab Results  Component Value Date   AST 19 01/30/2022   ALT 18 01/30/2022   ALKPHOS 53 01/30/2022   BILITOT 0.7 01/30/2022    SCr (since admission):   Serum creatinine: 1.01 mg/dL 62/69/48 5462 Estimated creatinine clearance: 59.4 mL/min  Current therapy and lipid therapy tolerance Current lipid-lowering therapy: atorvastatin 40mg /day Previous lipid-lowering therapies (if applicable): atorvastatin 20mg /day Documented or reported allergies or intolerances to lipid-lowering therapies (if applicable): none  Plan:    1.Statin intensity (high intensity recommended for all patients regardless of the LDL):  No statin changes. The patient is already on a high intensity statin. No increase as the dose was changed from 20mg > 40mg  in 01/2022  2.Add ezetimibe (if any one of the following):   Not indicated at this time.  3.Refer to lipid clinic:   No  4.Follow-up with:  Primary care provider - Pcp, No  5.Follow-up labs after discharge:  No changes in lipid therapy, repeat a lipid panel in one year.     , PharmD Clinical Pharmacist **Pharmacist phone directory can now be found on [email or web address] ([email or web address]).  Listed under [facility].

## 2022-02-03 ENCOUNTER — Encounter (HOSPITAL_COMMUNITY): Payer: Self-pay | Admitting: Vascular Surgery

## 2022-02-10 ENCOUNTER — Telehealth: Payer: Self-pay | Admitting: Physician Assistant

## 2022-02-10 NOTE — Telephone Encounter (Signed)
-----   Message from Dara Lords, [place] sent at 02/01/2022 10:41 AM EST ----- S/p right TCAR 12/1.  Pt is from [place]-please have pt come in on 12/20 ([facility] clinic day) for carotid duplex as pt is returning to [place] on 12/26.  Thanks

## 2022-02-12 ENCOUNTER — Encounter (HOSPITAL_BASED_OUTPATIENT_CLINIC_OR_DEPARTMENT_OTHER): Payer: Self-pay

## 2022-02-12 ENCOUNTER — Emergency Department (HOSPITAL_COMMUNITY): Payer: Medicare (Managed Care)

## 2022-02-12 ENCOUNTER — Other Ambulatory Visit: Payer: Self-pay

## 2022-02-12 ENCOUNTER — Emergency Department (HOSPITAL_BASED_OUTPATIENT_CLINIC_OR_DEPARTMENT_OTHER)
Admission: EM | Admit: 2022-02-12 | Discharge: 2022-02-12 | Disposition: A | Discharge status: Home / Self Care | Payer: Medicare (Managed Care) | Attending: Emergency Medicine | Admitting: Emergency Medicine

## 2022-02-12 ENCOUNTER — Emergency Department (HOSPITAL_BASED_OUTPATIENT_CLINIC_OR_DEPARTMENT_OTHER): Payer: Medicare (Managed Care)

## 2022-02-12 DIAGNOSIS — R531 Weakness: Secondary | ICD-10-CM | POA: Diagnosis present

## 2022-02-12 DIAGNOSIS — G459 Transient cerebral ischemic attack, unspecified: Secondary | ICD-10-CM | POA: Diagnosis not present

## 2022-02-12 HISTORY — DX: Cerebral infarction, unspecified: I63.9

## 2022-02-12 LAB — COMPREHENSIVE METABOLIC PANEL
ALT: 12 U/L (ref 0–44)
AST: 15 U/L (ref 15–41)
Albumin: 4.7 g/dL (ref 3.5–5.0)
Alkaline Phosphatase: 52 U/L (ref 38–126)
Anion gap: 14 (ref 5–15)
BUN: 22 mg/dL (ref 8–23)
CO2: 21 mmol/L — ABNORMAL LOW (ref 22–32)
Calcium: 10.6 mg/dL — ABNORMAL HIGH (ref 8.9–10.3)
Chloride: 104 mmol/L (ref 98–111)
Creatinine, Ser: 0.93 mg/dL (ref 0.61–1.24)
GFR, Estimated: >60 mL/min (ref >60)
Glucose, Bld: 105 mg/dL — ABNORMAL HIGH (ref 70–99)
Potassium: 4.1 mmol/L (ref 3.5–5.1)
Sodium: 139 mmol/L (ref 135–145)
Total Bilirubin: 0.3 mg/dL (ref 0.3–1.2)
Total Protein: 7.3 g/dL (ref 6.5–8.1)

## 2022-02-12 LAB — CBC WITH DIFFERENTIAL/PLATELET
Abs Immature Granulocytes: 0.04 10*3/uL (ref 0.00–0.07)
Basophils Absolute: 0.1 10*3/uL (ref 0.0–0.1)
Basophils Relative: 1 %
Eosinophils Absolute: 0.4 10*3/uL (ref 0.0–0.5)
Eosinophils Relative: 4 %
HCT: 42.2 % (ref 39.0–52.0)
Hemoglobin: 13.9 g/dL (ref 13.0–17.0)
Immature Granulocytes: 0 %
Lymphocytes Relative: 20 %
Lymphs Abs: 2 10*3/uL (ref 0.7–4.0)
MCH: 26.7 pg (ref 26.0–34.0)
MCHC: 32.9 g/dL (ref 30.0–36.0)
MCV: 81.2 fL (ref 80.0–100.0)
Monocytes Absolute: 0.7 10*3/uL (ref 0.1–1.0)
Monocytes Relative: 7 %
Neutro Abs: 6.6 10*3/uL (ref 1.7–7.7)
Neutrophils Relative %: 68 %
Platelets: 277 10*3/uL (ref 150–400)
RBC: 5.2 MIL/uL (ref 4.22–5.81)
RDW: 15 % (ref 11.5–15.5)
WBC: 9.8 10*3/uL (ref 4.0–10.5)
nRBC: 0 % (ref 0.0–0.2)

## 2022-02-12 LAB — PROTIME-INR
INR: 1.1 (ref 0.8–1.2)
Prothrombin Time: 13.7 seconds (ref 11.4–15.2)

## 2022-02-12 LAB — CBG MONITORING, ED
Glucose-Capillary: 115 mg/dL — ABNORMAL HIGH (ref 70–99)
Glucose-Capillary: 116 mg/dL — ABNORMAL HIGH (ref 70–99)

## 2022-02-12 NOTE — ED Provider Notes (Signed)
[facility] EMERGENCY DEPT Provider Note   CSN: [id number] Arrival date & time: 02/12/22  1426     History Chief Complaint  Patient presents with   Weakness    HPI Osias Resnick is a 76 y.o. male presenting for imbalance, weakness. LKW 11PM 730am awakening, progressive symptoms around11AM Now symptoms are starting to improve.  He describes his symptoms as weakness of the left leg, weakness of the left arm earlier today.  He denies fevers or chills, nausea vomiting, syncope shortness of breath.  He is otherwise ambulatory tolerating p.o. intake. Denies any other symptoms leading into this event.  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  Patient's recorded medical, surgical, social, medication list and allergies were reviewed in the Snapshot window as part of the initial history.   Review of Systems   Review of Systems  Constitutional:  Positive for fatigue. Negative for chills and fever.  HENT:  Negative for ear pain and sore throat.   Eyes:  Negative for pain and visual disturbance.  Respiratory:  Negative for cough and shortness of breath.   Cardiovascular:  Negative for chest pain and palpitations.  Gastrointestinal:  Negative for abdominal pain and vomiting.  Genitourinary:  Negative for dysuria and hematuria.  Musculoskeletal:  Negative for arthralgias and back pain.  Skin:  Negative for color change and rash.  Neurological:  Positive for weakness. Negative for seizures and syncope.  All other systems reviewed and are negative.   Physical Exam Updated Vital Signs BP (!) 140/71   Pulse 61   Temp 98.2 F (36.8 C) (Oral)   Resp 16   Ht 5\' 5"  (1.651 m)   Wt 76.4 kg   SpO2 99%   BMI 28.03 kg/m  Physical Exam Vitals and nursing note reviewed.  Constitutional:      General: He is not in acute distress.    Appearance: He is well-developed.  HENT:     Head: Normocephalic and atraumatic.   Eyes:     Conjunctiva/sclera: Conjunctivae normal.  Cardiovascular:     Rate and Rhythm: Normal rate and regular rhythm.     Heart sounds: No murmur heard. Pulmonary:     Effort: Pulmonary effort is normal. No respiratory distress.     Breath sounds: Normal breath sounds.  Abdominal:     Palpations: Abdomen is soft.     Tenderness: There is no abdominal tenderness.  Musculoskeletal:        General: No swelling.     Cervical back: Neck supple.  Skin:    General: Skin is warm and dry.     Capillary Refill: Capillary refill takes less than 2 seconds.  Neurological:     Mental Status: He is alert.     Sensory: No sensory deficit.     Motor: No weakness.  Psychiatric:        Mood and Affect: Mood normal.      ED Course/ Medical Decision Making/ A&P Clinical Course as of 02/12/22 2340  Wed Feb 12, 2022  1703 MRI and DC [CC]    Clinical Course User Index [CC] Feb 14, 2022, MD    Procedures Procedures   Medications Ordered in ED Medications - No data to display  Medical Decision Making:    Gurtej Noyola is a 76 y.o. male who presented to the ED today with left-sided weakness detailed above.     Patient's presentation is complicated by their history of multiple comorbid medical problems including recent  stroke.  Patient placed on continuous vitals and telemetry monitoring while in ED which was reviewed periodically.   Complete initial physical exam performed, notably the patient  was hemodynamically stable in no acute distress.      Reviewed and confirmed nursing documentation for past medical history, family history, social history.    Initial Assessment:   With the patient's presentation of left-sided weakness, most likely diagnosis is TIA given the improving nature. Other diagnoses were considered including (but not limited to) recurrence of old stroke. These are considered less likely due to history of present illness and physical exam findings.   This is  most consistent with an acute life/limb threatening illness complicated by underlying chronic conditions.  Initial Plan:  CT head to evaluate for structural intracranial abnormality Screening labs including CBC and Metabolic panel to evaluate for infectious or metabolic etiology of disease.  Urinalysis with reflex culture ordered to evaluate for UTI or relevant urologic/nephrologic pathology.  CXR to evaluate for structural/infectious intrathoracic pathology.  EKG to evaluate for cardiac pathology. Objective evaluation as below reviewed with plan for close reassessment  Initial Study Results:   Laboratory  All laboratory results reviewed without evidence of clinically relevant pathology.   EKG was reviewed independently. Rate, rhythm, axis, intervals all examined and without medically relevant abnormality. ST segments without concerns for elevations.    Radiology  All images reviewed independently. Agree with radiology report at this time.    Consults:  Case discussed with Dr. Iver Nestle of neurology.  She recommended MRI and transfer to main campus for reassessment.   Final Assessment and Plan:   MRI ordered, discussed with Dr. Durwin Nora at main campus who agreed with need for transfer.  Patient to undergo MRI and likely be stable for outpatient care management pending these results. Patient transferred via POV with spouse.    Clinical Impression:  1. Weakness   2. TIA (transient ischemic attack)      Discharge   Final Clinical Impression(s) / ED Diagnoses Final diagnoses:  Weakness  TIA (transient ischemic attack)    Rx / DC Orders ED Discharge Orders     None         Glyn Ade, MD 02/12/22 2340

## 2022-02-12 NOTE — ED Notes (Signed)
[facility] ED Charge RN Chestine Spore receives report. Dixon MD accepting provider.

## 2022-02-12 NOTE — ED Notes (Signed)
Patient transported to CT 

## 2022-02-12 NOTE — Discharge Instructions (Addendum)
Your MRI did not show any new stroke.  Please follow up with your neurologist for outpatient evaluation in the next several weeks.  Continue to take your medications as prescribed.

## 2022-02-12 NOTE — ED Provider Notes (Signed)
76 year old male with history of diabetes, hypertension, CVA, transferred here from [facility] with concerns for strokelike symptoms.  Patient initially seen and evaluated by Dr. Doran Durand, please see his note for complete H&P.  In short since last night which is patient's last known normal, he has had some tingling sensations left side of face as well as weakness and tingling sensation to left side of body.  Symptoms seems to be improving a bit while waiting throughout the day today.  He does endorse feeling hungry.  States have history of diabetes and have not takes medication yet.  Otherwise he denies any fever or chills no associated pain.  He is currently on Plavix and ASA along with statin  On exam, patient is a bit hoarse however no facial asymmetry, sensation is intact throughout all 4 extremities with equal strength.  He is alert and oriented x 4.  Right carotid surgical site without any signs of infection.  -Labs ordered, independently viewed and interpreted by me.  Labs remarkable for CBG 116 -The patient was maintained on a cardiac monitor.  I personally viewed and interpreted the cardiac monitored which showed an underlying rhythm of: NSR -Imaging independently viewed and interpreted by me and I agree with radiologist's interpretation.  Result remarkable for brain MRI showing no acute changes.  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  The differential diagnosis includes acute stroke, recrudescence of prior stroke, TIA -Co morbidities that complicate the patient evaluation includes CVA, HTN, DM -Treatment includes none -Reevaluation of the patient after these medicines showed that the patient resolved -PCP office notes or outside notes reviewed -Discussion with  specialist neurologist DR. Wilford Corner who felt pt can be discharge home with outpt f/u in a few weeks -Escalation to admission/observation considered: patients feels much better, is comfortable with discharge, and will follow up with PCP -Prescription medication considered, patient comfortable with current treatment -Social Determinant of Health considered  BP (!) 143/81   Pulse 66   Temp (!) 97.5 F (36.4 C) (Oral)   Resp 16   Ht  (1.651 m)   Wt 76.4 kg   SpO2 99%   BMI 28.03 kg/m   Results for orders placed or performed during the hospital encounter of 02/12/22  CBC with Differential  Result Value Ref Range   WBC 9.8 4.0 - 10.5 K/uL   RBC 5.20 4.22 - 5.81 MIL/uL   Hemoglobin 13.9 13.0 - 17.0 g/dL   HCT 16.1 09.6 - 04.5 %   MCV 81.2 80.0 - 100.0 fL   MCH 26.7 26.0 - 34.0 pg   MCHC 32.9 30.0 - 36.0 g/dL   RDW 40.9 81.1 - 91.4 %   Platelets 277 150 - 400 K/uL   nRBC 0.0 0.0 - 0.2 %   Neutrophils Relative % 68 %   Neutro Abs 6.6 1.7 - 7.7 K/uL   Lymphocytes Relative 20 %   Lymphs Abs 2.0 0.7 - 4.0 K/uL   Monocytes Relative 7 %   Monocytes Absolute 0.7 0.1 - 1.0 K/uL   Eosinophils Relative 4 %   Eosinophils Absolute 0.4 0.0 - 0.5 K/uL   Basophils Relative 1 %   Basophils Absolute 0.1 0.0 - 0.1 K/uL   Immature Granulocytes 0 %   Abs Immature Granulocytes 0.04 0.00 - 0.07 K/uL  Comprehensive metabolic panel  Result Value Ref Range   Sodium 139 135 - 145 mmol/L   Potassium 4.1 3.5 - 5.1 mmol/L   Chloride 104 98 - 111 mmol/L   CO2 21 (L) 22 - 32 mmol/L   Glucose, Bld 105 (H) 70 - 99 mg/dL   BUN 22 8 - 23 mg/dL   Creatinine, Ser 1.61 0.61 - 1.24 mg/dL   Calcium 09.6 (H) 8.9 - 10.3 mg/dL   Total Protein 7.3 6.5 - 8.1 g/dL   Albumin 4.7 3.5 - 5.0 g/dL   AST 15 15 - 41 U/L   ALT 12 0 - 44 U/L   Alkaline Phosphatase 52 38 - 126 U/L   Total Bilirubin 0.3 0.3 - 1.2 mg/dL   GFR, Estimated >04 >54 mL/min   Anion gap 14 5 - 15  Protime-INR  Result Value Ref Range    Prothrombin Time 13.7 11.4 - 15.2 seconds   INR 1.1 0.8 - 1.2  CBG monitoring, ED  Result Value Ref Range   Glucose-Capillary 115 (H) 70 - 99 mg/dL  CBG monitoring, ED  Result Value Ref Range   Glucose-Capillary 116 (H) 70 - 99 mg/dL   MR BRAIN WO CONTRAST  Result Date: 02/12/2022 CLINICAL DATA:  Weakness and left lower extremity tingling EXAM: MRI HEAD WITHOUT CONTRAST TECHNIQUE: Multiplanar, multiecho pulse sequences of the brain and surrounding structures were obtained without intravenous contrast. COMPARISON:  01/28/2022 FINDINGS: Brain: No acute infarct, mass effect or extra-axial collection. No acute or chronic hemorrhage. There is multifocal hyperintense T2-weighted signal within the white matter. Parenchymal volume and CSF spaces are normal. 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 The midline structures are normal. Vascular: Major flow voids are preserved. Skull and upper cervical spine: Normal calvarium and skull base. Visualized upper cervical spine and soft tissues are normal. Sinuses/Orbits:No paranasal sinus fluid levels or advanced mucosal thickening. No mastoid or middle ear effusion. Normal orbits. IMPRESSION: 1. No acute intracranial abnormality. 2. 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 3. Chronic small vessel disease. Electronically Signed   By: Deatra Robinson M.D.   On: 02/12/2022 23:03   CT Head Wo Contrast  Result Date: 02/12/2022 CLINICAL DATA:  Neuro deficit, acute, stroke suspected. Left leg weakness and tingling. Left facial and arm tingling. Recent strokes. EXAM: CT HEAD WITHOUT CONTRAST TECHNIQUE: Contiguous axial images were obtained from the base of the skull through the vertex without intravenous contrast. RADIATION DOSE REDUCTION: This exam was performed according to the departmental dose-optimization program which includes automated exposure control, adjustment of the mA and/or kV according to patient size and/or use of iterative  reconstruction technique. COMPARISON:  Head CT and MRI 01/28/2022 FINDINGS: Brain: A subacute right thalamic infarct is unchanged. The punctate right frontoparietal infarcts on the prior MRI are not clearly visible by CT. No acute infarct, intracranial hemorrhage, mass, midline shift, or extra-axial fluid collection is identified. There is mild cerebral atrophy. Mild chronic small vessel ischemia is again noted in the cerebral white matter. Vascular: Calcified atherosclerosis at the skull base. No hyperdense vessel. Skull: No fracture or suspicious osseous lesion. Sinuses/Orbits: Mild bilateral ethmoid air cell mucosal thickening. Clear mastoid air cells. Bilateral cataract extraction. Other: None. IMPRESSION: 1. No evidence of acute intracranial abnormality. 2. Unchanged right thalamic infarct. 3. Mild chronic small vessel ischemia. Electronically Signed   By: Sebastian Ache M.D.   On: 02/12/2022 15:34   Structural Heart Procedure  Result Date: 01/31/2022 See surgical note for result.  HYBRID OR  IMAGING ([facility] ONLY)  Result Date: 01/31/2022 There is no interpretation for this exam.  This order is for images obtained during a surgical procedure.  Please See "Surgeries" Tab for more information regarding the procedure.   MR BRAIN WO CONTRAST  Result Date: 01/28/2022 CLINICAL DATA:  Stroke, follow up. Recent stroke. Recurrent left arm and leg numbness. EXAM: MRI HEAD WITHOUT CONTRAST TECHNIQUE: Multiplanar, multiecho pulse sequences of the brain and surrounding structures were obtained without intravenous contrast. COMPARISON:  Head CT 01/28/2022 and MRI 01/26/2022 FINDINGS: Brain: Compared with the recent prior MRI, there are multiple new subcentimeter acute cortical and subcortical infarcts in the right frontal and right parietal lobes. A recent right thalamic infarct is unchanged in size. No mass, midline shift, or extra-axial fluid collection is identified. Chronic T2 hyperintensities in the cerebral white  matter bilaterally are unchanged and nonspecific but compatible with mild chronic small vessel ischemic disease. There is mild cerebral atrophy. A single chronic microhemorrhage is again noted in the right centrum semiovale. Vascular: Major intracranial vascular flow voids are preserved. Skull and upper cervical spine: Unremarkable bone marrow signal. Sinuses/Orbits: Bilateral cataract extraction. Mild mucosal thickening in the paranasal sinuses. Clear mastoid air cells. Other: None. IMPRESSION: 1. Multiple new small acute infarcts in the right frontal and parietal lobes. 2. Unchanged recent right thalamic infarct. 3. Mild chronic small vessel ischemic disease. Electronically Signed   By: Sebastian Ache M.D.   On: 01/28/2022 15:16   MR ANGIO HEAD WO CONTRAST  Result Date: 01/28/2022 CLINICAL DATA:  Stroke, follow up. Recent stroke. Recurrent left arm and leg numbness. EXAM: MRA HEAD WITHOUT CONTRAST TECHNIQUE: Angiographic images of the Circle of Willis were acquired using MRA technique without intravenous contrast. COMPARISON:  Head and neck CTA 01/25/2022 FINDINGS: Anterior circulation: The internal carotid arteries are widely patent from skull base to carotid termini. ACAs and MCAs are patent without evidence of a proximal branch occlusion or significant A1 or M1 stenosis. Detailed assessment for potential branch vessel stenoses is limited by motion artifact, however no significant stenosis was evident on the recent prior CTA. No aneurysm is identified. Posterior circulation: The included intracranial vertebral arteries are patent to the basilar. The right PICA and left AICA appear dominant. Patent SCA origins are seen bilaterally. The basilar artery is widely patent. There is a large right posterior communicating artery with hypoplastic or absent right P1 segment. Both PCAs are patent without evidence of a significant proximal stenosis. No aneurysm is identified. Anatomic variants: Fetal right PCA.  Absent  right A1 segment. IMPRESSION: No large vessel occlusion or significant proximal stenosis. Electronically Signed   By: Sebastian Ache M.D.   On: 01/28/2022 15:07   CT HEAD CODE STROKE WO CONTRAST  Result Date: 01/28/2022 CLINICAL DATA:  Code stroke. Neuro deficit, acute, stroke suspected. EXAM: CT HEAD WITHOUT CONTRAST TECHNIQUE: Contiguous axial images were obtained from the base of the skull through the vertex without intravenous contrast. RADIATION DOSE REDUCTION: This exam was performed according to the departmental dose-optimization program which includes automated exposure control, adjustment of the mA and/or kV according to patient size and/or use of iterative reconstruction technique. COMPARISON:  MRI 2 days ago.  Head CT 3 days ago. FINDINGS: Brain: The brainstem and cerebellum appear normal. Focal low-density in the posterolateral right thalamus corresponds with the acute infarction shown by recent MRI. No evidence of progression or hemorrhage. Otherwise, cerebral hemispheres show age related volume loss without other acute insult. Small right posterior frontal cortical infarction shown by  MRI is not visible. No mass, hemorrhage, hydrocephalus or extra-axial collection. Vascular: Major vessels at the base of the brain show flow. Skull: Negative Sinuses/Orbits: Mild mucosal thickening.  Orbits normal. Other: None ASPECTS ([place] Stroke Program Early CT Score) - Ganglionic level infarction (caudate, lentiform nuclei, internal capsule, insula, M1-M3 cortex): 7 - Supraganglionic infarction (M4-M6 cortex): 3 Total score (0-10 with 10 being normal): 10 IMPRESSION: 1. Focal low-density in the posterolateral right thalamus corresponds with the acute infarction shown by recent MRI. No evidence of progression or hemorrhage. 2. Small right posterior frontal cortical infarction shown by MRI is not visible. No other finding. 3. Aspects is 10. These results were communicated to Dr. Roda Shutters at 12:27 pm on 01/28/2022 by  text page via the [facility] messaging system. Electronically Signed   By: Paulina Fusi M.D.   On: 01/28/2022 12:28   ECHOCARDIOGRAM COMPLETE  Result Date: 01/26/2022    ECHOCARDIOGRAM REPORT   Patient Name:   Gavin [name] Date of Exam: 01/26/2022 Medical Rec #:  [id number]       Height:       65.0 in Accession #:    [id number]      Weight:       168.4 lb Date of Birth:  1945/04/27       BSA:          1.839 m Patient Age:    76 years        BP:           161/68 mmHg Patient Gender: M               HR:           74 bpm. Exam Location:  Inpatient Procedure: 2D Echo, Cardiac Doppler and Color Doppler Indications:    Stroke  History:        Patient has prior history of Echocardiogram examinations, most                 recent 01/01/2015. Risk Factors:Diabetes, Dyslipidemia,                 Hypertension and Former Smoker. GERD.  Sonographer:    Ross Ludwig RDCS (AE) Referring Phys: [facility]NE[facility] P [facility]  Sonographer Comments: Suboptimal parasternal window. IMPRESSIONS  1. Left ventricular ejection fraction, by estimation, is 50 to 55%. The left ventricle has low normal function. The left ventricle demonstrates regional wall motion abnormalities (see scoring diagram/findings for description). There is mild concentric left ventricular hypertrophy. Left ventricular diastolic parameters are indeterminate.  2. Right ventricular systolic function is normal. The right ventricular size is normal. Tricuspid regurgitation signal is inadequate for assessing PA pressure.  3. The mitral valve is grossly normal. Trivial mitral valve regurgitation.  4. The aortic valve was not well visualized. Aortic valve regurgitation is not visualized. Aortic valve mean gradient measures 2.0 mmHg.  5. The inferior vena cava is normal in size with greater than 50% respiratory variability, suggesting right atrial pressure of 3 mmHg. Comparison(s): Prior images unable to be directly viewed. FINDINGS  Left Ventricle: Left ventricular ejection fraction,  by estimation, is 50 to 55%. The left ventricle has low normal function. The left ventricle demonstrates regional wall motion abnormalities. The left ventricular internal cavity size was normal in size. There is mild concentric left ventricular hypertrophy. Left ventricular diastolic parameters are indeterminate.  LV Wall Scoring: The inferior wall and basal inferoseptal segment are hypokinetic. The entire anterior wall, entire lateral wall, entire anterior septum, entire apex, and  mid inferoseptal segment are normal. Right Ventricle: The right ventricular size is normal. No increase in right ventricular wall thickness. Right ventricular systolic function is normal. Tricuspid regurgitation signal is inadequate for assessing PA pressure. Left Atrium: Left atrial size was normal in size. Right Atrium: Right atrial size was normal in size. Pericardium: There is no evidence of pericardial effusion. Mitral Valve: The mitral valve is grossly normal. Trivial mitral valve regurgitation. Tricuspid Valve: The tricuspid valve is grossly normal. Tricuspid valve regurgitation is trivial. Aortic Valve: The aortic valve was not well visualized. Aortic valve regurgitation is not visualized. Aortic valve mean gradient measures 2.0 mmHg. Aortic valve peak gradient measures 4.7 mmHg. Aortic valve area, by VTI measures 2.43 cm. Pulmonic Valve: The pulmonic valve was not well visualized. Pulmonic valve regurgitation is not visualized. Aorta: The aortic root is normal in size and structure. Venous: The inferior vena cava is normal in size with greater than 50% respiratory variability, suggesting right atrial pressure of 3 mmHg. IAS/Shunts: No atrial level shunt detected by color flow Doppler.  LEFT VENTRICLE PLAX 2D LVIDd:         4.40 cm     Diastology LVIDs:         3.10 cm     LV e' medial:    8.59 cm/s LV PW:         1.20 cm     LV E/e' medial:  7.7 LV IVS:        1.10 cm     LV e' lateral:   7.62 cm/s LVOT diam:     2.10 cm     LV  E/e' lateral: 8.6 LV SV:         53 LV SV Index:   29 LVOT Area:     3.46 cm  LV Volumes (MOD) LV vol d, MOD A2C: 70.3 ml LV vol d, MOD A4C: 93.4 ml LV vol s, MOD A2C: 31.5 ml LV vol s, MOD A4C: 48.8 ml LV SV MOD A2C:     38.8 ml LV SV MOD A4C:     93.4 ml LV SV MOD BP:      43.3 ml RIGHT VENTRICLE             IVC RV Basal diam:  3.10 cm     IVC diam: 1.90 cm RV S prime:     13.10 cm/s TAPSE (M-mode): 1.9 cm LEFT ATRIUM           Index        RIGHT ATRIUM           Index LA diam:      3.70 cm 2.01 cm/m   RA Area:     12.00 cm LA Vol (A4C): 49.1 ml 26.70 ml/m  RA Volume:   24.10 ml  13.11 ml/m  AORTIC VALVE AV Area (Vmax):    2.69 cm AV Area (Vmean):   2.53 cm AV Area (VTI):     2.43 cm AV Vmax:           108.00 cm/s AV Vmean:          70.600 cm/s AV VTI:            0.218 m AV Peak Grad:      4.7 mmHg AV Mean Grad:      2.0 mmHg LVOT Vmax:         83.80 cm/s LVOT Vmean:        51.500 cm/s LVOT VTI:  0.153 m LVOT/AV VTI ratio: 0.70  AORTA Ao Root diam: 3.30 cm Ao Asc diam:  3.30 cm MITRAL VALVE MV Area (PHT): 2.37 cm    SHUNTS MV Decel Time: 320 msec    Systemic VTI:  0.15 m MV E velocity: 65.80 cm/s  Systemic Diam: 2.10 cm MV A velocity: 62.50 cm/s MV E/A ratio:  1.05 Nona Dell MD Electronically signed by Nona Dell MD Signature Date/Time: 01/26/2022/4:44:19 PM    Final    MR BRAIN WO CONTRAST  Result Date: 01/26/2022 CLINICAL DATA:  Stroke follow-up. EXAM: MRI HEAD WITHOUT CONTRAST TECHNIQUE: Multiplanar, multiecho pulse sequences of the brain and surrounding structures were obtained without intravenous contrast. COMPARISON:  CT/CTA head and neck 1 day prior FINDINGS: Brain: There is an acute infarct in the right thalamus without associated hemorrhage or mass effect. There is additional punctate infarct in the high right frontal lobe cortex (2-44). There is no acute intracranial hemorrhage, extra-axial fluid collection, or acute infarct. Parenchymal volume is stable. The ventricles  are stable in size. Patchy FLAIR signal abnormality in the supratentorial white matter likely reflects sequela of underlying chronic small-vessel ischemic change. This has progressed since 2013. There is a single punctate chronic microhemorrhage in the right centrum semiovale, nonspecific. There is no mass lesion.  There is no mass effect or midline shift. Vascular: Normal flow voids. Skull and upper cervical spine: Normal marrow signal. Sinuses/Orbits: There is mild mucosal thickening in the paranasal sinuses. Bilateral lens implants are in place. The globes and orbits are otherwise unremarkable. Other: None. IMPRESSION: Acute infarct in the right thalamus and punctate acute infarct in the high right frontal lobe cortex. Electronically Signed   By: Lesia Hausen M.D.   On: 01/26/2022 15:28   CT HEAD WO CONTRAST  Result Date: 01/25/2022 CLINICAL DATA:  Stroke, follow-up EXAM: CT HEAD WITHOUT CONTRAST TECHNIQUE: Contiguous axial images were obtained from the base of the skull through the vertex without intravenous contrast. RADIATION DOSE REDUCTION: This exam was performed according to the departmental dose-optimization program which includes automated exposure control, adjustment of the mA and/or kV according to patient size and/or use of iterative reconstruction technique. COMPARISON:  01/25/2022 2:06 p.m. FINDINGS: Brain: No evidence of acute infarction, hemorrhage, mass, mass effect, or midline shift. No hydrocephalus or extra-axial fluid collection. Vascular: No hyperdense vessel. Atherosclerotic calcifications in the intracranial carotid and vertebral arteries. Skull: Normal. Negative for fracture or focal lesion. Sinuses/Orbits: Mucosal thickening in the ethmoid air cells. Status post bilateral lens replacements. Other: The mastoid air cells are well aerated. IMPRESSION: No acute intracranial process. Electronically Signed   By: Wiliam Ke M.D.   On: 01/25/2022 21:02   CT ANGIO HEAD NECK W WO CM (CODE  STROKE)  Result Date: 01/25/2022 CLINICAL DATA:  Code stroke EXAM: CT ANGIOGRAPHY HEAD AND NECK TECHNIQUE: Multidetector CT imaging of the head and neck was performed using the standard protocol during bolus administration of intravenous contrast. Multiplanar CT image reconstructions and MIPs were obtained to evaluate the vascular anatomy. Carotid stenosis measurements (when applicable) are obtained utilizing NASCET criteria, using the distal internal carotid diameter as the denominator. RADIATION DOSE REDUCTION: This exam was performed according to the departmental dose-optimization program which includes automated exposure control, adjustment of the mA and/or kV according to patient size and/or use of iterative reconstruction technique. CONTRAST:  29mL OMNIPAQUE IOHEXOL 350 MG/ML SOLN COMPARISON:  Same-day noncontrast head CT, CTA neck 12/17/2011, MRA head and neck 12/16/2011. FINDINGS: CTA NECK FINDINGS Aortic arch: There is  mild calcified plaque in the aortic arch. The origins of the major branch vessels are patent. The subclavian arteries are patent to the level imaged. Right carotid system: The right common carotid artery is patent. There is mixed plaque in the proximal internal carotid artery without hemodynamically significant stenosis relative to the distal vessel. The distal internal carotid artery is widely patent. The external carotid artery is patent. There is no dissection or aneurysm. Left carotid system: The left common, internal, and external carotid arteries are patent, with mild plaque at the bifurcation but no hemodynamically significant stenosis or occlusion. There is no dissection or aneurysm. Vertebral arteries: The vertebral arteries are patent, without hemodynamically significant stenosis or occlusion. There is no dissection or aneurysm. Skeleton: There is disc space narrowing and degenerative endplate change in the lower cervical spine. There is no acute osseous abnormality or suspicious  osseous lesion. There is no visible canal hematoma. Other neck: The soft tissues of the neck are unremarkable. Upper chest: The imaged lung apices are clear. Review of the MIP images confirms the above findings CTA HEAD FINDINGS Anterior circulation: There is mild calcified plaque in the carotid siphons without significant stenosis or occlusion. The bilateral MCAs are patent, without proximal stenosis or occlusion. The bilateral ACAs are patent, without proximal stenosis or occlusion. The right A1 segment is hypoplastic/absent, a normal variant. There is no aneurysm or AVM. Posterior circulation: The bilateral V4 segments are patent. The basilar artery is patent. The major cerebellar arteries appear patent. The bilateral PCAs are patent, without proximal stenosis or occlusion. There is a fetal origin of the right PCA. A diminutive left posterior communicating artery is also identified. There is no aneurysm or AVM. Venous sinuses: Patent. Anatomic variants: As above. Review of the MIP images confirms the above findings IMPRESSION: 1. No emergent large vessel occlusion. 2. Atherosclerotic plaque at the carotid bifurcations, right more than left, without hemodynamically significant stenosis or occlusion. Findings communicated to Dr. Amada JupiterKirkpatrick via [employer] at 2:30 p.m. Electronically Signed   By: Lesia HausenPeter  Noone M.D.   On: 01/25/2022 14:32   CT HEAD CODE STROKE WO CONTRAST  Result Date: 01/25/2022 CLINICAL DATA:  Code stroke. EXAM: CT HEAD WITHOUT CONTRAST TECHNIQUE: Contiguous axial images were obtained from the base of the skull through the vertex without intravenous contrast. RADIATION DOSE REDUCTION: This exam was performed according to the departmental dose-optimization program which includes automated exposure control, adjustment of the mA and/or kV according to patient size and/or use of iterative reconstruction technique. COMPARISON:  Head CT 12/16/2011 FINDINGS: Brain: There is no acute intracranial  hemorrhage, extra-axial fluid collection, or acute infarct. Parenchymal volume is within expected limits for age. The ventricles are normal in size. Gray-white differentiation is preserved. There is mild background chronic small-vessel ischemic change. There is no mass lesion.  There is no mass effect or midline shift. Vascular: There is calcification of the bilateral carotid siphons. Skull: Normal. Negative for fracture or focal lesion. Sinuses/Orbits: There is mild mucosal thickening in the paranasal sinuses. Bilateral lens implants are in place. The globes and orbits are otherwise unremarkable. Other: None. ASPECTS [facility] [place]([facility] Early CT Score) - Ganglionic level infarction (caudate, lentiform nuclei, internal capsule, insula, M1-M3 cortex): 7 - Supraganglionic infarction (M4-M6 cortex): 3 Total score (0-10 with 10 being normal): 10 IMPRESSION: 1. No acute intracranial pathology. 2. ASPECTS is 10 Findings communicated via Amion to Dr. Amada JupiterKirkpatrick at 2:11 p.m. Electronically Signed   By: Lesia HausenPeter  Noone M.D.   On: 01/25/2022 14:13  Fayrene Helper, PA-C 02/12/22 2316    Ernie Avena, MD 02/13/22 [phone]

## 2022-02-12 NOTE — ED Notes (Signed)
Re-sent page to on call Neurology

## 2022-02-12 NOTE — ED Triage Notes (Signed)
In for eval of weakness to left leg with tingling. Has tingling to left side of care and left arm as well. Recent discharge from hospital post CVA with clot buster meds and stent placement in left neck. Onset s/s 1100 today. Hoarse voice, SOB, coughing, slight headache.

## 2022-02-13 ENCOUNTER — Ambulatory Visit: Payer: Medicare (Managed Care) | Admitting: Physical Therapy

## 2022-02-14 ENCOUNTER — Other Ambulatory Visit (HOSPITAL_COMMUNITY): Payer: Self-pay

## 2022-02-14 ENCOUNTER — Telehealth (HOSPITAL_COMMUNITY): Payer: Self-pay | Admitting: Emergency Medicine

## 2022-02-14 MED ORDER — CLOPIDOGREL BISULFATE 75 MG PO TABS: 75.0000 mg | ORAL_TABLET | Freq: Every day | ORAL | 0 refills | Status: AC

## 2022-02-19 ENCOUNTER — Ambulatory Visit (HOSPITAL_COMMUNITY)
Admission: RE | Admit: 2022-02-19 | Discharge: 2022-02-19 | Disposition: A | Discharge status: Home / Self Care | Payer: Medicare (Managed Care) | Source: Ambulatory Visit | Attending: Physician Assistant | Admitting: Physician Assistant

## 2022-02-19 ENCOUNTER — Ambulatory Visit (INDEPENDENT_AMBULATORY_CARE_PROVIDER_SITE_OTHER): Payer: Medicare (Managed Care) | Admitting: Physician Assistant

## 2022-02-19 VITALS — BP 144/90 | HR 115 | Temp 98.2°F | Resp 20 | Ht 65.0 in | Wt 159.5 lb

## 2022-02-19 DIAGNOSIS — Z8673 Personal history of transient ischemic attack (TIA), and cerebral infarction without residual deficits: Secondary | ICD-10-CM | POA: Insufficient documentation

## 2022-02-19 DIAGNOSIS — I6521 Occlusion and stenosis of right carotid artery: Secondary | ICD-10-CM

## 2022-02-19 NOTE — Progress Notes (Signed)
POST OPERATIVE OFFICE NOTE    CC:  F/u for surgery  HPI:  76 year old male with recent history of right hemispheric distribution stroke. He presented to the ED 01/25/22 with sudden onset of L mouth numbness, which proceeded down his L arm and then L leg. He received TNK and had no residual deficits on discharge except for some perioral numbness. was placed on aspirin Plavix return home.  He returned to the ED on 01/28/22 returned left sided numbness.   Recurrent stroke. There is not high-grade stenosis of the internal carotid artery on the right by NASCET criteria he does have approximately 50% stenosis when measured relative to the normal diameter with what appears to be soft plaque likely an embolic source of stroke.    He was taken to the OR by Dr. Randie Heinzain for Right TCAR. He is here today for f/u.      Stroke:  Acute right thalamus infarct and punctate right frontal lobe infarct, etiology likely small vessel disease .  Atherosclerotic plaque at the carotid bifurcations, right more than left, without hemodynamically significant stenosis or occlusion. Given 2 separate stroke locations, recommend 30-day cardiac event monitoring with local PCP in [place] to rule out A-fib. He was discharged on ASA. Plavix and Statin.     He continues to feel weak, but his "stroke" symptoms are improved.  He has balance trouble when first getting up and he states he has floaters in the visual fields.  No amaurosis.  His acid reflux has worsened without his omeprazole since he has been started on Plavix.  He drinks a lot of caffeine.     They plan to return to [place] in the next few days.         Allergies  Allergen Reactions   Altace [Ramipril] Anaphylaxis   Cortizone-10 [Hydrocortisone] Other (See Comments)    Vision problems- affected ability to see far away    Current Outpatient Medications  Medication Sig Dispense Refill   acetaminophen (TYLENOL) 325 MG tablet Take 325-650 mg by mouth every 6 (six)  hours as needed for mild pain or headache.     aspirin 81 MG chewable tablet Chew 1 tablet (81 mg total) by mouth daily. 30 tablet 0   atorvastatin (LIPITOR) 40 MG tablet Take 1 tablet (40 mg total) by mouth daily. 30 tablet 0   benzonatate (TESSALON PERLES) 100 MG capsule Take 1 capsule (100 mg total) by mouth every 6 (six) hours as needed for cough. 30 capsule 0   chlorpheniramine-HYDROcodone (TUSSIONEX) 10-8 MG/5ML Take 5 mLs by mouth every 8 (eight) hours as needed for cough. 115 mL 0   clopidogrel (PLAVIX) 75 MG tablet Take 1 tablet (75 mg total) by mouth daily. 21 tablet 0   Dentifrices (SENSODYNE PRONAMEL) 5-0.25 % PSTE Place 1 application  onto teeth in the morning and at bedtime.     finasteride (PROSCAR) 5 MG tablet Take 5 mg by mouth daily.      glimepiride (AMARYL) 1 MG tablet Take 1 mg by mouth daily.     metFORMIN (GLUCOPHAGE) 1000 MG tablet Take 1,000 mg by mouth 2 (two) times daily with a meal.     MONOVISC 88 MG/4ML SOSY intra-articular injection Inject into the articular space See admin instructions. Into bilateral knees every 3 months     NOVOLOG FLEXPEN 100 UNIT/ML FlexPen Inject 14-16 Units into the skin in the morning.     omeprazole (PRILOSEC) 20 MG capsule Take 20 mg by mouth daily before breakfast.  PRESCRIPTION MEDICATION Apply 1 application  topically See admin instructions. Mometasone Furoate 0.1% Topical Solution- Apply to the scalp three times a week     sildenafil (VIAGRA) 50 MG tablet Take 50 mg by mouth daily as needed.     tamsulosin (FLOMAX) 0.4 MG CAPS capsule Take 0.4 mg by mouth daily.     TOUJEO SOLOSTAR 300 UNIT/ML SOPN Inject 24-26 Units into the skin at bedtime.     No current facility-administered medications for this visit.     ROS:  See HPI  Physical Exam:  Right Carotid Findings:  +----------+--------+--------+--------+------------------+--------+           PSV cm/sEDV cm/sStenosisPlaque DescriptionComments   +----------+--------+--------+--------+------------------+--------+  CCA Prox  79      13                                          +----------+--------+--------+--------+------------------+--------+  CCA Mid   82      17                                          +----------+--------+--------+--------+------------------+--------+  CCA Distal64      16                                Stent     +----------+--------+--------+--------+------------------+--------+  ICA Prox  52      13      Normal                    Stent     +----------+--------+--------+--------+------------------+--------+  ICA Mid   53      13                                Stent     +----------+--------+--------+--------+------------------+--------+  ICA Distal78      23                                          +----------+--------+--------+--------+------------------+--------+  ECA      107     15                                          +----------+--------+--------+--------+------------------+--------+   +----------+--------+-------+----------------+-------------------+           PSV cm/sEDV cmsDescribe        Arm Pressure (mmHG)  +----------+--------+-------+----------------+-------------------+  [id number]            Multiphasic, WNL                     +----------+--------+-------+----------------+-------------------+   +---------+--------+--+--------+--+---------+  VertebralPSV cm/s72EDV cm/s15Antegrade  +---------+--------+--+--------+--+---------+      Left Carotid Findings:  +----------+--------+--------+--------+-----------------------+--------+           PSV cm/sEDV cm/sStenosisPlaque Description     Comments  +----------+--------+--------+--------+-----------------------+--------+  CCA Prox  101     20                                               +----------+--------+--------+--------+-----------------------+--------+  CCA Mid   71      13                                               +----------+--------+--------+--------+-----------------------+--------+  CCA Distal59      15                                               +----------+--------+--------+--------+-----------------------+--------+  ICA Prox  71      18      1-39%   smooth and heterogenous          +----------+--------+--------+--------+-----------------------+--------+  ICA Mid   84      27                                               +----------+--------+--------+--------+-----------------------+--------+  ICA Distal89      25                                               +----------+--------+--------+--------+-----------------------+--------+  ECA      116     17                                               +----------+--------+--------+--------+-----------------------+--------+   +----------+--------+--------+----------------+-------------------+           PSV cm/sEDV cm/sDescribe        Arm Pressure (mmHG)  +----------+--------+--------+----------------+-------------------+  Subclavian130            Multiphasic, WNL                     +----------+--------+--------+----------------+-------------------+   +---------+--------+--+--------+-+----------------------------+  VertebralPSV cm/s55EDV cm/s7Antegrade and High resistant  +---------+--------+--+--------+-+----------------------------+         Summary:  Right Carotid: There was no evidence of thrombus, dissection,  atherosclerotic                plaque or stenosis in the cervical carotid system. Right  distal                CCA to ICA stent is patent.   Left Carotid: Velocities in the left ICA are consistent with a 1-39%  stenosis.               The extracranial vessels were near-normal with only minimal  wall               thickening or plaque.   Incision:  well healed neck incision Extremities:  moving  all extremities no gross weakness noted  Neuro: no tongue deviation or facial droop Abdomen:  soft Lungs non labored breathing    Assessment/Plan:   He returned to the ED on 01/28/22 returned left sided numbness.   Recurrent stroke. There is not high-grade stenosis of the internal carotid artery on the right by NASCET criteria he does have approximately 50% stenosis when measured  relative to the normal diameter with what appears to be soft plaque likely an embolic source of stroke.   This is a 76 y.o. male who is s/p: Right TCAR.     Stroke:  Acute right thalamus infarct and punctate right frontal lobe infarct, etiology likely small vessel disease .  Atherosclerotic plaque at the carotid bifurcations, right more than left, without hemodynamically significant stenosis or occlusion. Given 2 separate stroke locations, recommend 30-day cardiac event monitoring with local PCP in [place] to rule out A-fib. He was discharged on ASA. Plavix and Statin.   He will try Pepcid OTC for the acid reflux.  He states his distance vision seems disturbed and he has floaters.  He would benefit from a visit to his optometrist.  Activity as tolerates.     Mosetta Pigeon PA-C [facility] [phone]   Clinic MD:  Randie Heinz

## 2022-05-06 ENCOUNTER — Other Ambulatory Visit: Payer: Self-pay

## 2022-05-06 NOTE — Patient Outreach (Signed)
First telephone outreach attempt to obtain mRS. No answer. Left message for returned call.  Philmore Pali [facility] Management Assistant [phone]

## 2022-05-09 ENCOUNTER — Other Ambulatory Visit: Payer: Self-pay

## 2022-05-09 NOTE — Patient Outreach (Signed)
Second telephone outreach attempt to obtain mRS. No answer. Unable to leave message for returned call.  Shaletha Humble [facility] Management Assistant [phone]  

## 2022-05-12 ENCOUNTER — Other Ambulatory Visit: Payer: Self-pay

## 2022-05-12 NOTE — Patient Outreach (Signed)
3 outreach attempts were completed to obtain mRs. mRs could not be obtained because patient never returned my calls. mRs=7    Gavin [name] Management Assistant [phone]  

## 2023-06-02 DIAGNOSIS — E1129 Type 2 diabetes mellitus with other diabetic kidney complication: Secondary | ICD-10-CM | POA: Diagnosis not present

## 2023-06-02 DIAGNOSIS — Z794 Long term (current) use of insulin: Secondary | ICD-10-CM | POA: Diagnosis not present

## 2023-06-02 DIAGNOSIS — Z8673 Personal history of transient ischemic attack (TIA), and cerebral infarction without residual deficits: Secondary | ICD-10-CM | POA: Diagnosis not present

## 2023-06-02 DIAGNOSIS — K5909 Other constipation: Secondary | ICD-10-CM | POA: Diagnosis not present

## 2023-06-02 DIAGNOSIS — I739 Peripheral vascular disease, unspecified: Secondary | ICD-10-CM | POA: Diagnosis not present

## 2023-06-02 DIAGNOSIS — I779 Disorder of arteries and arterioles, unspecified: Secondary | ICD-10-CM | POA: Diagnosis not present

## 2023-06-02 DIAGNOSIS — E785 Hyperlipidemia, unspecified: Secondary | ICD-10-CM | POA: Diagnosis not present

## 2023-06-02 DIAGNOSIS — I10 Essential (primary) hypertension: Secondary | ICD-10-CM | POA: Diagnosis not present

## 2023-06-03 DIAGNOSIS — E119 Type 2 diabetes mellitus without complications: Secondary | ICD-10-CM | POA: Diagnosis not present

## 2023-06-03 DIAGNOSIS — E7800 Pure hypercholesterolemia, unspecified: Secondary | ICD-10-CM | POA: Diagnosis not present

## 2023-06-03 DIAGNOSIS — Z79899 Other long term (current) drug therapy: Secondary | ICD-10-CM | POA: Diagnosis not present

## 2023-06-03 DIAGNOSIS — K219 Gastro-esophageal reflux disease without esophagitis: Secondary | ICD-10-CM | POA: Diagnosis not present

## 2023-06-03 DIAGNOSIS — I10 Essential (primary) hypertension: Secondary | ICD-10-CM | POA: Diagnosis not present

## 2023-08-03 DIAGNOSIS — M25562 Pain in left knee: Secondary | ICD-10-CM | POA: Diagnosis not present

## 2023-08-03 DIAGNOSIS — M25552 Pain in left hip: Secondary | ICD-10-CM | POA: Diagnosis not present

## 2023-08-03 DIAGNOSIS — I10 Essential (primary) hypertension: Secondary | ICD-10-CM | POA: Diagnosis not present

## 2023-08-10 DIAGNOSIS — M25512 Pain in left shoulder: Secondary | ICD-10-CM | POA: Diagnosis not present

## 2023-08-10 DIAGNOSIS — M1712 Unilateral primary osteoarthritis, left knee: Secondary | ICD-10-CM | POA: Diagnosis not present

## 2023-08-14 DIAGNOSIS — M25512 Pain in left shoulder: Secondary | ICD-10-CM | POA: Diagnosis not present

## 2023-08-17 DIAGNOSIS — R109 Unspecified abdominal pain: Secondary | ICD-10-CM | POA: Diagnosis not present

## 2023-08-17 DIAGNOSIS — K5909 Other constipation: Secondary | ICD-10-CM | POA: Diagnosis not present

## 2023-08-19 DIAGNOSIS — R69 Illness, unspecified: Secondary | ICD-10-CM | POA: Diagnosis not present
